# Patient Record
Sex: Female | Born: 1975 | Race: Black or African American | Hispanic: No | Marital: Single | Smoking: Former smoker
Health system: Southern US, Community
[De-identification: ages and names within clinical notes are randomized; demographics above are authoritative.]

## PROBLEM LIST (undated history)

## (undated) DIAGNOSIS — D649 Anemia, unspecified: Secondary | ICD-10-CM

## (undated) DIAGNOSIS — I10 Essential (primary) hypertension: Secondary | ICD-10-CM

## (undated) HISTORY — PX: SHOULDER ARTHROSCOPY: SHX128

## (undated) HISTORY — PX: WISDOM TOOTH EXTRACTION: SHX21

## (undated) HISTORY — PX: GASTRIC BYPASS: SHX52

## (undated) HISTORY — PX: KNEE ARTHROSCOPY: SUR90

## (undated) HISTORY — PX: TUBAL LIGATION: SHX77

---

## 1997-12-10 ENCOUNTER — Ambulatory Visit (HOSPITAL_COMMUNITY): Admission: RE | Admit: 1997-12-10 | Discharge: 1997-12-10 | Payer: Self-pay | Admitting: *Deleted

## 1998-01-23 ENCOUNTER — Ambulatory Visit (HOSPITAL_COMMUNITY): Admission: RE | Admit: 1998-01-23 | Discharge: 1998-01-23 | Payer: Self-pay | Admitting: Obstetrics

## 1998-04-03 ENCOUNTER — Ambulatory Visit (HOSPITAL_COMMUNITY): Admission: RE | Admit: 1998-04-03 | Discharge: 1998-04-03 | Payer: Self-pay

## 1998-06-17 ENCOUNTER — Inpatient Hospital Stay (HOSPITAL_COMMUNITY): Admission: AD | Admit: 1998-06-17 | Discharge: 1998-06-19 | Payer: Self-pay | Admitting: *Deleted

## 2000-03-03 ENCOUNTER — Other Ambulatory Visit: Admission: RE | Admit: 2000-03-03 | Discharge: 2000-03-03 | Payer: Self-pay | Admitting: Obstetrics and Gynecology

## 2000-04-22 ENCOUNTER — Ambulatory Visit (HOSPITAL_COMMUNITY): Admission: RE | Admit: 2000-04-22 | Discharge: 2000-04-22 | Payer: Self-pay | Admitting: Obstetrics & Gynecology

## 2000-04-22 ENCOUNTER — Encounter: Payer: Self-pay | Admitting: Obstetrics and Gynecology

## 2000-08-26 ENCOUNTER — Encounter: Payer: Self-pay | Admitting: Obstetrics and Gynecology

## 2000-08-26 ENCOUNTER — Ambulatory Visit (HOSPITAL_COMMUNITY): Admission: RE | Admit: 2000-08-26 | Discharge: 2000-08-26 | Payer: Self-pay | Admitting: Obstetrics and Gynecology

## 2000-09-10 ENCOUNTER — Encounter (INDEPENDENT_AMBULATORY_CARE_PROVIDER_SITE_OTHER): Payer: Self-pay | Admitting: Specialist

## 2000-09-10 ENCOUNTER — Inpatient Hospital Stay (HOSPITAL_COMMUNITY): Admission: AD | Admit: 2000-09-10 | Discharge: 2000-09-13 | Payer: Self-pay | Admitting: Obstetrics and Gynecology

## 2002-06-20 ENCOUNTER — Emergency Department (HOSPITAL_COMMUNITY): Admission: EM | Admit: 2002-06-20 | Discharge: 2002-06-20 | Payer: Self-pay | Admitting: Emergency Medicine

## 2002-06-22 ENCOUNTER — Emergency Department (HOSPITAL_COMMUNITY): Admission: EM | Admit: 2002-06-22 | Discharge: 2002-06-22 | Payer: Self-pay | Admitting: Emergency Medicine

## 2004-08-24 ENCOUNTER — Emergency Department (HOSPITAL_COMMUNITY): Admission: EM | Admit: 2004-08-24 | Discharge: 2004-08-24 | Payer: Self-pay | Admitting: Emergency Medicine

## 2004-12-02 ENCOUNTER — Emergency Department (HOSPITAL_COMMUNITY): Admission: EM | Admit: 2004-12-02 | Discharge: 2004-12-02 | Payer: Self-pay | Admitting: Emergency Medicine

## 2005-08-05 ENCOUNTER — Emergency Department (HOSPITAL_COMMUNITY): Admission: EM | Admit: 2005-08-05 | Discharge: 2005-08-05 | Payer: Self-pay | Admitting: Emergency Medicine

## 2005-11-28 ENCOUNTER — Emergency Department (HOSPITAL_COMMUNITY): Admission: EM | Admit: 2005-11-28 | Discharge: 2005-11-28 | Payer: Self-pay | Admitting: Emergency Medicine

## 2007-04-04 ENCOUNTER — Emergency Department: Payer: Self-pay | Admitting: Emergency Medicine

## 2007-04-06 ENCOUNTER — Emergency Department: Payer: Self-pay | Admitting: Emergency Medicine

## 2007-04-15 ENCOUNTER — Emergency Department (HOSPITAL_COMMUNITY): Admission: EM | Admit: 2007-04-15 | Discharge: 2007-04-15 | Payer: Self-pay | Admitting: Emergency Medicine

## 2007-11-14 ENCOUNTER — Ambulatory Visit: Payer: Self-pay | Admitting: Vascular Surgery

## 2007-11-14 ENCOUNTER — Emergency Department (HOSPITAL_COMMUNITY): Admission: EM | Admit: 2007-11-14 | Discharge: 2007-11-14 | Payer: Self-pay | Admitting: Emergency Medicine

## 2007-11-14 ENCOUNTER — Encounter (INDEPENDENT_AMBULATORY_CARE_PROVIDER_SITE_OTHER): Payer: Self-pay | Admitting: Emergency Medicine

## 2009-02-04 ENCOUNTER — Ambulatory Visit: Payer: Self-pay | Admitting: Surgery

## 2009-02-12 ENCOUNTER — Encounter: Admission: RE | Admit: 2009-02-12 | Discharge: 2009-05-13 | Payer: Self-pay | Admitting: Surgery

## 2009-02-28 ENCOUNTER — Ambulatory Visit: Payer: Self-pay | Admitting: Surgery

## 2009-04-25 ENCOUNTER — Ambulatory Visit: Payer: Self-pay | Admitting: Urology

## 2009-05-19 ENCOUNTER — Encounter: Admission: RE | Admit: 2009-05-19 | Discharge: 2009-08-17 | Payer: Self-pay | Admitting: Surgery

## 2009-08-11 HISTORY — PX: GASTRIC BYPASS: SHX52

## 2011-01-08 ENCOUNTER — Ambulatory Visit: Payer: Self-pay | Admitting: Internal Medicine

## 2011-01-10 ENCOUNTER — Ambulatory Visit: Payer: Self-pay | Admitting: Internal Medicine

## 2011-02-09 ENCOUNTER — Ambulatory Visit: Payer: Self-pay | Admitting: Internal Medicine

## 2011-02-17 ENCOUNTER — Ambulatory Visit: Payer: Self-pay | Admitting: Internal Medicine

## 2011-02-26 NOTE — Discharge Summary (Signed)
[facility]  Patient:    Mary Alvarado, Mary Alvarado                     MRN: [id number] Adm. Date:  [id number] Disc. Date: 09/12/00 Attending:  Michaele Offer                           Discharge Summary  DISCHARGE DIAGNOSES:          1. Intrauterine pregnancy at 38 weeks,                                  delivered.                               2. Group B strep carrier.                               3. Obesity.                               4. Desires surgical sterility.                               5. Status post precipitous vaginal delivery.                               6. Status post bilateral partial                                  salpingectomy, postpartum.  DISCHARGE MEDICATIONS:        1. Percocet one to two tablets p.o. every four                                  hours p.r.n. pain.                               2. Motrin 600 mg p.o. every six hours p.r.n.  HOSPITAL FOLLOW-UP:           The patient is to follow up in six weeks for her routine postpartum examination.  PRENATAL LABS:                A positive, antibody negative, sickle cell negative, RPR nonreactive, rubella immune, hepatitis B surface antigen negative, HIV negative,  GC negative, chlamydia negative, GBS positive.  OB HISTORY:                   In 1999 she had a spontaneous vaginal delivery at 40 weeks of a 7 pound 6 ounce infant. She had group B strep then.  PAST MEDICAL HISTORY:         None.  PAST SURGICAL HISTORY:        None.  MEDICATIONS:                  None.  ALLERGIES:  No known drug allergies.  PHYSICAL EXAMINATION:         On admission, the patients weight was approximately 295 pounds, temperature 100.3, fetal heart rate was reactive with regular contractions.  Abdomen was gravid and nontender, EFW was approximately 8 pounds.  HOSPITAL COURSE:              The patient is a 35 year old G2, P1-0-0-1 who is admitted at 3 weeks with  complaint of regular contractions.  She had no rupture of membranes or vaginal bleeding on admission. Vaginal examination on admission at 10:20 p.m. was 3 cm, 90% effaced and a -2 station.  Prenatal care was complicated by obesity making fundal heights  difficult, therefore, she had an ultrasound on August 26, 2000, which revealed a normal fetus about the 50th percentile.  The patient was admitted and begun on her penicillin prophylaxis and received an epidural approximately one hour after admission at 4 to 5 cm dilated. Though the physician was paged at 3 a.m., the patient had gone from 6 cm to 10 cm and +3 station in approximately 30 minutes.  The physician immediately reported to the hospital, however, on arrival, the infant was already delivered.  Apgars were 8 and 9.  Weight was 7 pounds 7 ounces and perineum was intact.  Placenta was spontaneously delivered and intact.  After delivery, the patient elected to proceed with a tubal ligation for permanent sterility and underwent that procedure on September 11, 2000, without complications.  She had bilateral partial salpingectomy under epidural anesthesia and then was admitted for routine postpartum and postoperative care.  On postpartum day #1, the patient was afebrile with stable vital signs. She was doing very well. Her pain was well-controlled and given the option, she elected to be discharged home with follow-up at the office as previously stated. DD:  09/12/00 TD:  09/12/00 Job: [id number]

## 2011-02-26 NOTE — Op Note (Signed)
[facility]   Patient:    Mary Alvarado, Mary Alvarado                     MRN: [id number] Proc. Date: 09/11/00 Adm. Date:  [id number] Attending:  Michaele Offer                           Operative Report  PREOPERATIVE DIAGNOSIS:       Desired surgical sterility.  POSTOPERATIVE DIAGNOSIS:      Desired surgical sterility.  OPERATION:                    Bilateral partial salpingectomy.  SURGEON:                      Zenaida Niece, M.D.  ANESTHESIA:                   Epidural.  ESTIMATED BLOOD LOSS:         Less than 50 cc.  FINDINGS:                     Normal anatomy.  COUNTS:                       Correct.  CONDITION:                    Stable.  DESCRIPTION OF PROCEDURE:     After appropriate informed consent was obtained, the patient was taken to the operating room and placed in the dorsal supine position.  Her previously placed epidural was dosed up appropriately, and her abdomen was prepped and draped in the usual sterile fashion.  The level of her anesthesia was found to be adequate, and her infraumbilical skin was infiltrated with Marcaine 0.25%.  A 3 cm horizontal incision was made, and the fascia was identified and entered sharply with scissors which also entered the peritoneal cavity.  Both fallopian tubes were identified and traced to their fimbriated ends.  A knuckle of tube on each side was ligated with 0 plain gut suture.  The knuckle of tube was removed sharply.  Both ostia were identified, and the stumps were hemostatic.  The fascia was reapproximated in a running fashion with 0 Vicryl and the skin closed with running subcuticular 3-0 Vicryl.  The patient tolerated the procedure well and was taken to the recovery room in stable condition. DD:  09/11/00 TD:  09/11/00 Job: [id number]

## 2011-03-12 ENCOUNTER — Ambulatory Visit: Payer: Self-pay

## 2011-03-12 ENCOUNTER — Ambulatory Visit: Payer: Self-pay | Admitting: Internal Medicine

## 2011-11-19 ENCOUNTER — Encounter (HOSPITAL_COMMUNITY): Payer: Self-pay | Admitting: Pharmacist

## 2011-11-19 ENCOUNTER — Other Ambulatory Visit: Payer: Self-pay | Admitting: Obstetrics and Gynecology

## 2011-11-24 ENCOUNTER — Encounter (HOSPITAL_COMMUNITY)
Admission: RE | Admit: 2011-11-24 | Discharge: 2011-11-24 | Disposition: A | Discharge status: Home / Self Care | Payer: Managed Care, Other (non HMO) | Source: Ambulatory Visit | Attending: Obstetrics and Gynecology | Admitting: Obstetrics and Gynecology

## 2011-11-24 ENCOUNTER — Encounter (HOSPITAL_COMMUNITY): Payer: Self-pay

## 2011-11-24 HISTORY — DX: Anemia, unspecified: D64.9

## 2011-11-24 LAB — CBC
HCT: 35.6 % — ABNORMAL LOW (ref 36.0–46.0)
Hemoglobin: 10.3 g/dL — ABNORMAL LOW (ref 12.0–15.0)
RDW: 18.1 % — ABNORMAL HIGH (ref 11.5–15.5)
WBC: 3.5 10*3/uL — ABNORMAL LOW (ref 4.0–10.5)

## 2011-11-24 NOTE — Progress Notes (Signed)
Patient denies having any elevated b/p in the past.  Dr. Malen Gauze was made aware of patients b/p today at pre=op appointment.  No new orders received from MD at this time.  MD states that he will evaluate the patient on the DOS. Pt was encouraged to make an appointment with her PCP for follow up.

## 2011-11-24 NOTE — Patient Instructions (Signed)
   Your procedure is scheduled on: Friday February 15  Enter through the [employer] [facility] at: 2:00pm Pick up the phone at the desk and dial [phone] and inform us of your arrival.  Please call this number if you have any problems the morning of surgery: [phone]  Remember: Do not eat food after midnight: Thursday Do not drink clear liquids after: 11:30  Do not wear jewelry, make-up, or FINGER nail polish Do not wear lotions, powders, perfumes or deodorant. Do not shave 48 hours prior to surgery. Do not bring valuables to the hospital.  Patients discharged on the day of surgery will not be allowed to drive home.    Remember to use your hibiclens as instructed.Please shower with 1/2 bottle the evening before your surgery and the other 1/2 bottle the morning of surgery.

## 2011-11-25 NOTE — H&P (Signed)
NAME:  Mary Alvarado, Mary Alvarado NO.:  [id number]  MEDICAL RECORD NO.:  [id number]  LOCATION:  [facility]                           FACILITY:  [facility]  PHYSICIAN:  Lenoard Aden, M.D.DATE OF BIRTH:  07-Dec-1975  DATE OF ADMISSION:  11/24/2011 DATE OF DISCHARGE:  11/24/2011                             HISTORY & PHYSICAL   CHIEF COMPLAINT:  Menorrhagia with secondary anemia.  She is a 36 year old African American female with known uterine fibroids, who has currently been on OCPs, iron and IUD without correction of her bleeding and secondary anemia.  ALLERGIES:  She has no known drug allergies.  MEDICATIONS:  Iron therapy, birth control pills, Mirena and multivitamin.  She is a nonsmoker, nondrinker.  She denies domestic or physical violence.  She has a pregnancy history of vaginal delivery x2, shoulder surgery, tubal ligation, gastric bypass and knee surgery.  FAMILY HISTORY:  Diabetes, hypertension.  PHYSICAL EXAM:  Well-developed, well-nourished African American female, height of 64 inches, weight of 215 pounds.  HEENT normal.  Neck supple. Full range of motion.  Lungs are clear.  Heart, regular rate and rhythm. Abdomen soft, nontender.  Pelvic exam reveals the uterus to be bulky and anteflexed.  No adnexal masses appreciated.  Two small subserosal fibroids were noted.  IMPRESSION:  Refractory menorrhagia for definitive therapy.  PLAN:  Diagnostic hysteroscopy, D and C, NovaSure endometrial ablation. IUD removal.  Risks of anesthesia, infection, bleeding, injury to abdominal organs, need for repair was discussed, delayed versus immediate complications to include bowel and bladder injury noted.  The patient acknowledges and wishes to proceed.     Lenoard Aden, M.D.     RJT/MEDQ  D:  11/25/2011  T:  11/25/2011  Job:  [id number]

## 2011-11-26 ENCOUNTER — Other Ambulatory Visit: Payer: Self-pay | Admitting: Obstetrics and Gynecology

## 2011-11-26 ENCOUNTER — Ambulatory Visit (HOSPITAL_COMMUNITY): Payer: Managed Care, Other (non HMO) | Admitting: Anesthesiology

## 2011-11-26 ENCOUNTER — Encounter (HOSPITAL_COMMUNITY): Payer: Self-pay | Admitting: Anesthesiology

## 2011-11-26 ENCOUNTER — Ambulatory Visit (HOSPITAL_COMMUNITY)
Admission: RE | Admit: 2011-11-26 | Discharge: 2011-11-26 | Disposition: A | Discharge status: Home / Self Care | Payer: Managed Care, Other (non HMO) | Source: Ambulatory Visit | Attending: Obstetrics and Gynecology | Admitting: Obstetrics and Gynecology

## 2011-11-26 ENCOUNTER — Encounter (HOSPITAL_COMMUNITY)
Admission: RE | Disposition: A | Discharge status: Home / Self Care | Payer: Self-pay | Source: Ambulatory Visit | Attending: Obstetrics and Gynecology

## 2011-11-26 ENCOUNTER — Encounter (HOSPITAL_COMMUNITY): Payer: Self-pay | Admitting: *Deleted

## 2011-11-26 DIAGNOSIS — N920 Excessive and frequent menstruation with regular cycle: Secondary | ICD-10-CM | POA: Insufficient documentation

## 2011-11-26 DIAGNOSIS — Z01818 Encounter for other preprocedural examination: Secondary | ICD-10-CM | POA: Insufficient documentation

## 2011-11-26 DIAGNOSIS — Z01812 Encounter for preprocedural laboratory examination: Secondary | ICD-10-CM | POA: Insufficient documentation

## 2011-11-26 LAB — SURGICAL PCR SCREEN: MRSA, PCR: NEGATIVE

## 2011-11-26 SURGERY — DILATATION & CURETTAGE/HYSTEROSCOPY WITH NOVASURE ABLATION
Anesthesia: General | Site: Vagina | Wound class: Clean Contaminated

## 2011-11-26 MED ORDER — MEPERIDINE HCL 25 MG/ML IJ SOLN: 6.2500 mg | INTRAMUSCULAR | Status: DC | PRN

## 2011-11-26 MED ORDER — KETOROLAC TROMETHAMINE 30 MG/ML IJ SOLN
INTRAMUSCULAR | Status: AC
  Filled 2011-11-26: qty 1

## 2011-11-26 MED ORDER — PROPOFOL 10 MG/ML IV EMUL
INTRAVENOUS | Status: AC
  Filled 2011-11-26: qty 20

## 2011-11-26 MED ORDER — FENTANYL CITRATE 0.05 MG/ML IJ SOLN
INTRAMUSCULAR | Status: DC | PRN
  Administered 2011-11-26 (×2): 50 ug via INTRAVENOUS

## 2011-11-26 MED ORDER — KETOROLAC TROMETHAMINE 30 MG/ML IJ SOLN
INTRAMUSCULAR | Status: DC | PRN
  Administered 2011-11-26 (×2): 30 mg via INTRAVENOUS

## 2011-11-26 MED ORDER — MIDAZOLAM HCL 5 MG/5ML IJ SOLN
INTRAMUSCULAR | Status: DC | PRN
  Administered 2011-11-26: 2 mg via INTRAVENOUS

## 2011-11-26 MED ORDER — MUPIROCIN 2 % EX OINT
TOPICAL_OINTMENT | CUTANEOUS | Status: AC
  Filled 2011-11-26: qty 22

## 2011-11-26 MED ORDER — CEFAZOLIN SODIUM 1-5 GM-% IV SOLN
1.0000 g | INTRAVENOUS | Status: AC
  Administered 2011-11-26: 1 g via INTRAVENOUS

## 2011-11-26 MED ORDER — CEFAZOLIN SODIUM 1-5 GM-% IV SOLN
INTRAVENOUS | Status: AC
  Filled 2011-11-26: qty 50

## 2011-11-26 MED ORDER — LACTATED RINGERS IV SOLN
INTRAVENOUS | Status: DC
  Administered 2011-11-26: 50 mL/h via INTRAVENOUS
  Administered 2011-11-26: 14:00:00 via INTRAVENOUS

## 2011-11-26 MED ORDER — BUPIVACAINE HCL (PF) 0.25 % IJ SOLN
INTRAMUSCULAR | Status: AC
  Filled 2011-11-26: qty 30

## 2011-11-26 MED ORDER — MUPIROCIN 2 % EX OINT
TOPICAL_OINTMENT | Freq: Two times a day (BID) | CUTANEOUS | Status: DC
  Administered 2011-11-26: 14:00:00 via NASAL

## 2011-11-26 MED ORDER — OXYCODONE-ACETAMINOPHEN 5-325 MG PO TABS: 1.0000 | ORAL_TABLET | ORAL | Status: AC | PRN

## 2011-11-26 MED ORDER — LIDOCAINE HCL (CARDIAC) 20 MG/ML IV SOLN
INTRAVENOUS | Status: AC
  Filled 2011-11-26: qty 5

## 2011-11-26 MED ORDER — BUPIVACAINE HCL (PF) 0.25 % IJ SOLN
INTRAMUSCULAR | Status: DC | PRN
  Administered 2011-11-26: 30 mL

## 2011-11-26 MED ORDER — LIDOCAINE HCL (CARDIAC) 20 MG/ML IV SOLN
INTRAVENOUS | Status: DC | PRN
  Administered 2011-11-26: 20 mg via INTRAVENOUS

## 2011-11-26 MED ORDER — FENTANYL CITRATE 0.05 MG/ML IJ SOLN
INTRAMUSCULAR | Status: AC
  Filled 2011-11-26: qty 2

## 2011-11-26 MED ORDER — MIDAZOLAM HCL 2 MG/2ML IJ SOLN
INTRAMUSCULAR | Status: AC
  Filled 2011-11-26: qty 2

## 2011-11-26 MED ORDER — PROPOFOL 10 MG/ML IV EMUL
INTRAVENOUS | Status: DC | PRN
  Administered 2011-11-26: 160 mg via INTRAVENOUS

## 2011-11-26 MED ORDER — ONDANSETRON HCL 4 MG/2ML IJ SOLN
INTRAMUSCULAR | Status: DC | PRN
  Administered 2011-11-26: 4 mg via INTRAVENOUS

## 2011-11-26 MED ORDER — DROPERIDOL 2.5 MG/ML IJ SOLN
INTRAMUSCULAR | Status: AC
  Filled 2011-11-26: qty 2

## 2011-11-26 MED ORDER — METOCLOPRAMIDE HCL 5 MG/ML IJ SOLN: 10.0000 mg | Freq: Once | INTRAMUSCULAR | Status: DC | PRN

## 2011-11-26 SURGICAL SUPPLY — 12 items
ABLATOR ENDOMETRIAL BIPOLAR (ABLATOR) ×2 IMPLANT
CATH ROBINSON RED A/P 16FR (CATHETERS) ×2 IMPLANT
CLOTH BEACON ORANGE TIMEOUT ST (SAFETY) ×2 IMPLANT
CONTAINER PREFILL 10% NBF 60ML (FORM) ×4 IMPLANT
GLOVE BIO SURGEON STRL SZ7.5 (GLOVE) ×4 IMPLANT
GOWN PREVENTION PLUS LG XLONG (DISPOSABLE) ×2 IMPLANT
GOWN STRL REIN XL XLG (GOWN DISPOSABLE) ×2 IMPLANT
PACK HYSTEROSCOPY LF (CUSTOM PROCEDURE TRAY) ×2 IMPLANT
PAD PREP 24X48 CUFFED NSTRL (MISCELLANEOUS) ×2 IMPLANT
SYR TB 1ML 25GX5/8 (SYRINGE) ×2 IMPLANT
TOWEL OR 17X24 6PK STRL BLUE (TOWEL DISPOSABLE) ×4 IMPLANT
WATER STERILE IRR 1000ML POUR (IV SOLUTION) ×2 IMPLANT

## 2011-11-26 NOTE — Anesthesia Postprocedure Evaluation (Signed)
Anesthesia Post Note  Patient: Mary Alvarado  Procedure(s) Performed: Procedure(s) (LRB): DILATATION & CURETTAGE/HYSTEROSCOPY WITH NOVASURE ABLATION (N/A)  Anesthesia type: General  Patient location: PACU  Post pain: Pain level controlled  Post assessment: Post-op Vital signs reviewed  Last Vitals:  Filed Vitals:   11/26/11 1420  BP: 167/95  Pulse: 73  Temp: 37 C  Resp: 18    Post vital signs: Reviewed  Level of consciousness: sedated  Complications: No apparent anesthesia complicationsfj

## 2011-11-26 NOTE — Anesthesia Preprocedure Evaluation (Signed)
Anesthesia Evaluation  Patient identified by MRN, date of birth, ID band Patient awake    Reviewed: Allergy & Precautions, H&P , NPO status , Patient's Chart, lab work & pertinent test results  Airway Mallampati: II TM Distance: >3 FB Neck ROM: Full    Dental No notable dental hx. (+) Teeth Intact   Pulmonary neg pulmonary ROS,  clear to auscultation  Pulmonary exam normal       Cardiovascular neg cardio ROS Regular Normal    Neuro/Psych Negative Neurological ROS  Negative Psych ROS   GI/Hepatic Neg liver ROS, S/P Gastric Bypass Surgery   Endo/Other  Negative Endocrine ROS  Renal/GU negative Renal ROS  Genitourinary negative   Musculoskeletal negative musculoskeletal ROS (+)   Abdominal (+) obese,   Peds  Hematology negative hematology ROS (+)   Anesthesia Other Findings   Reproductive/Obstetrics negative OB ROS                           Anesthesia Physical Anesthesia Plan  ASA: II  Anesthesia Plan: General   Post-op Pain Management:    Induction: Intravenous  Airway Management Planned: LMA  Additional Equipment:   Intra-op Plan:   Post-operative Plan: Extubation in OR  Informed Consent: I have reviewed the patients History and Physical, chart, labs and discussed the procedure including the risks, benefits and alternatives for the proposed anesthesia with the patient or authorized representative who has indicated his/her understanding and acceptance.   Dental advisory given  Plan Discussed with: CRNA, Anesthesiologist and Surgeon  Anesthesia Plan Comments:         Anesthesia Quick Evaluation

## 2011-11-26 NOTE — Progress Notes (Signed)
Pt's blood pressure 165/93 today.. Dr Malen Gauze aware and no new orders given.

## 2011-11-26 NOTE — Discharge Instructions (Signed)
DISCHARGE INSTRUCTIONS: HYSTEROSCOPY / ENDOMETRIAL ABLATION The following instructions have been prepared to help you care for yourself upon your return home.  May take Ibuprofen/Motrin products after 9:35 PM as needed for cramps  Personal hygiene: Marland Kitchen Use sanitary pads for vaginal drainage, not tampons. . Shower the day after your procedure. . NO tub baths, pools or Jacuzzis for 2-3 weeks. . Wipe front to back after using the bathroom.  Activity and limitations: . Do NOT drive or operate any equipment for 24 hours. The effects of anesthesia are still present and drowsiness may result. . Do NOT rest in bed all day. . Walking is encouraged. . Walk up and down stairs slowly. . You may resume your normal activity in one to two days or as indicated by your physician.  Sexual activity: NO intercourse for at least 2 weeks after the procedure, or as indicated by your Doctor.  Diet: Eat a light meal as desired this evening. You may resume your usual diet tomorrow.  Return to Work: You may resume your work activities in one to two days or as indicated by Therapist, sports.  What to expect after your surgery: Expect to have vaginal bleeding/discharge for 2-3 days and spotting for up to 10 days. It is not unusual to have soreness for up to 1-2 weeks. You may have a slight burning sensation when you urinate for the first day. Mild cramps may continue for a couple of days. You may have a regular period in 2-6 weeks.  Call your doctor for any of the following: . Excessive vaginal bleeding or clotting, saturating and changing one pad every hour. . Inability to urinate 6 hours after discharge from hospital. . Pain not relieved by pain medication. . Fever of 100.4 F or greater. . Unusual vaginal discharge or odor.  Post Anesthesia Care Unit [phone]

## 2011-11-26 NOTE — Progress Notes (Signed)
Patient ID: Mary Alvarado, female   DOB: Mar 13, 1976, 36 y.o.   MRN: [id number] Patient seen and examined. Consent witnessed and signed. No changes noted. Update completed.

## 2011-11-26 NOTE — Transfer of Care (Signed)
Immediate Anesthesia Transfer of Care Note  Patient: Mary Alvarado  Procedure(s) Performed: Procedure(s) (LRB): DILATATION & CURETTAGE/HYSTEROSCOPY WITH NOVASURE ABLATION (N/A)  Patient Location: PACU  Anesthesia Type: General  Level of Consciousness: awake  Airway & Oxygen Therapy: Patient Spontanous Breathing and Patient connected to nasal cannula oxygen  Post-op Assessment: Report given to PACU RN and Post -op Vital signs reviewed and stable  Post vital signs: Reviewed and stable  Complications: No apparent anesthesia complications

## 2011-11-26 NOTE — Op Note (Signed)
11/26/2011  3:39 PM  PATIENT:  Rella Larve  36 y.o. female  PRE-OPERATIVE DIAGNOSIS:  Menorrhagia  POST-OPERATIVE DIAGNOSIS:  Menorrhagia  PROCEDURE:  Procedure(s): DILATATION & CURETTAGE/HYSTEROSCOPY WITH NOVASURE ABLATION  SURGEON:  Surgeon(s): Lenoard Aden, MD  ASSISTANTS: none   ANESTHESIA:   local and general  ESTIMATED BLOOD LOSS: * No blood loss amount entered *   DRAINS: none   LOCAL MEDICATIONS USED:  MARCAINE     SPECIMEN:  Source of Specimen:  EMC and polyp  DISPOSITION OF SPECIMEN:  PATHOLOGY  COUNTS:  YES  DICTATION #: noted  PLAN OF CARE: DC home  PATIENT DISPOSITION:  PACU - hemodynamically stable.

## 2011-11-27 NOTE — Op Note (Signed)
NAME:  Mary Alvarado, Mary Alvarado NO.:  [id number]  MEDICAL RECORD NO.:  [id number]  LOCATION:  [facility]                          FACILITY:  [facility]  PHYSICIAN:  Lenoard Aden, M.D.DATE OF BIRTH:  1976/03/25  DATE OF PROCEDURE: DATE OF DISCHARGE:  11/26/2011                              OPERATIVE REPORT   DESCRIPTION OF PROCEDURE:  After being apprised of risks of anesthesia, infection, bleeding, injury to abdominal organs, need for repair, delayed versus immediate complications to include bowel and bladder injury, possible need for repair, the patient brought to the operating room, where she was administered general anesthetic, prepped and draped in usual sterile fashion.  Feet were placed in Yellofin stirrups.  After achieving adequate anesthesia, dilute Marcaine solution was placed and a paracervical block 20 mL total. Mirena IUD was removed intact without difficulty. Cervix was easily dilated up to a 25 Pratt dilator.  Hysteroscope placed.  Visualization reveals capacious endometrial cavity with small endometrial polyp, which was removed with polyp forceps.  The D and C was performed using sharp curettage in a four-quadrant method without difficulty.  The NovaSure device was placed, seated in the appropriate fashion to a length of 6.5, a width of 4.7 for 122 seconds after negative CO2 test and the device was removed, inspected, and found to be intact.  Revisualization reveals a well- ablated endometrial cavity without evidence of uterine perforation. Fluid deficit of 5 mL noted.  All instruments removed.  The patient tolerated procedure well and is transferred to the recovery room in good condition.     Lenoard Aden, M.D.     RJT/MEDQ  D:  11/26/2011  T:  11/27/2011  Job:  [id number]

## 2014-06-23 ENCOUNTER — Emergency Department (HOSPITAL_COMMUNITY): Payer: Managed Care, Other (non HMO)

## 2014-06-23 ENCOUNTER — Encounter (HOSPITAL_COMMUNITY): Payer: Self-pay | Admitting: Emergency Medicine

## 2014-06-23 ENCOUNTER — Emergency Department (HOSPITAL_COMMUNITY)
Admission: EM | Admit: 2014-06-23 | Discharge: 2014-06-23 | Disposition: A | Discharge status: Home / Self Care | Payer: Managed Care, Other (non HMO) | Attending: Emergency Medicine | Admitting: Emergency Medicine

## 2014-06-23 DIAGNOSIS — Z862 Personal history of diseases of the blood and blood-forming organs and certain disorders involving the immune mechanism: Secondary | ICD-10-CM | POA: Insufficient documentation

## 2014-06-23 DIAGNOSIS — S46909A Unspecified injury of unspecified muscle, fascia and tendon at shoulder and upper arm level, unspecified arm, initial encounter: Secondary | ICD-10-CM | POA: Diagnosis not present

## 2014-06-23 DIAGNOSIS — Y9389 Activity, other specified: Secondary | ICD-10-CM | POA: Insufficient documentation

## 2014-06-23 DIAGNOSIS — S4980XA Other specified injuries of shoulder and upper arm, unspecified arm, initial encounter: Secondary | ICD-10-CM | POA: Diagnosis not present

## 2014-06-23 DIAGNOSIS — S6990XA Unspecified injury of unspecified wrist, hand and finger(s), initial encounter: Secondary | ICD-10-CM | POA: Diagnosis not present

## 2014-06-23 DIAGNOSIS — S8990XA Unspecified injury of unspecified lower leg, initial encounter: Secondary | ICD-10-CM | POA: Insufficient documentation

## 2014-06-23 DIAGNOSIS — Z3202 Encounter for pregnancy test, result negative: Secondary | ICD-10-CM | POA: Insufficient documentation

## 2014-06-23 DIAGNOSIS — IMO0002 Reserved for concepts with insufficient information to code with codable children: Secondary | ICD-10-CM | POA: Diagnosis not present

## 2014-06-23 DIAGNOSIS — S99919A Unspecified injury of unspecified ankle, initial encounter: Principal | ICD-10-CM

## 2014-06-23 DIAGNOSIS — F411 Generalized anxiety disorder: Secondary | ICD-10-CM | POA: Insufficient documentation

## 2014-06-23 DIAGNOSIS — Y92410 Unspecified street and highway as the place of occurrence of the external cause: Secondary | ICD-10-CM | POA: Diagnosis not present

## 2014-06-23 DIAGNOSIS — S99929A Unspecified injury of unspecified foot, initial encounter: Principal | ICD-10-CM

## 2014-06-23 DIAGNOSIS — S59919A Unspecified injury of unspecified forearm, initial encounter: Secondary | ICD-10-CM

## 2014-06-23 DIAGNOSIS — F17200 Nicotine dependence, unspecified, uncomplicated: Secondary | ICD-10-CM | POA: Diagnosis not present

## 2014-06-23 DIAGNOSIS — S0990XA Unspecified injury of head, initial encounter: Secondary | ICD-10-CM | POA: Insufficient documentation

## 2014-06-23 DIAGNOSIS — S59909A Unspecified injury of unspecified elbow, initial encounter: Secondary | ICD-10-CM | POA: Insufficient documentation

## 2014-06-23 DIAGNOSIS — Z79899 Other long term (current) drug therapy: Secondary | ICD-10-CM | POA: Diagnosis not present

## 2014-06-23 LAB — LACTIC ACID, PLASMA: LACTIC ACID, VENOUS: 1.2 mmol/L (ref 0.5–2.2)

## 2014-06-23 LAB — CBC WITH DIFFERENTIAL/PLATELET
Basophils Absolute: 0 10*3/uL (ref 0.0–0.1)
Basophils Relative: 0 % (ref 0–1)
Eosinophils Absolute: 0.3 10*3/uL (ref 0.0–0.7)
Eosinophils Relative: 4 % (ref 0–5)
HCT: 37.5 % (ref 36.0–46.0)
HEMOGLOBIN: 12 g/dL (ref 12.0–15.0)
LYMPHS ABS: 1.3 10*3/uL (ref 0.7–4.0)
LYMPHS PCT: 19 % (ref 12–46)
MCH: 29.5 pg (ref 26.0–34.0)
MCHC: 32 g/dL (ref 30.0–36.0)
MCV: 92.1 fL (ref 78.0–100.0)
MONOS PCT: 11 % (ref 3–12)
Monocytes Absolute: 0.7 10*3/uL (ref 0.1–1.0)
NEUTROS PCT: 66 % (ref 43–77)
Neutro Abs: 4.5 10*3/uL (ref 1.7–7.7)
PLATELETS: 297 10*3/uL (ref 150–400)
RBC: 4.07 MIL/uL (ref 3.87–5.11)
RDW: 18.7 % — ABNORMAL HIGH (ref 11.5–15.5)
WBC: 6.8 10*3/uL (ref 4.0–10.5)

## 2014-06-23 LAB — BASIC METABOLIC PANEL
Anion gap: 12 (ref 5–15)
BUN: 13 mg/dL (ref 6–23)
CHLORIDE: 106 meq/L (ref 96–112)
CO2: 22 meq/L (ref 19–32)
Calcium: 8.4 mg/dL (ref 8.4–10.5)
Creatinine, Ser: 0.8 mg/dL (ref 0.50–1.10)
GFR calc Af Amer: >90 mL/min (ref >90)
GLUCOSE: 94 mg/dL (ref 70–99)
POTASSIUM: 4 meq/L (ref 3.7–5.3)
SODIUM: 140 meq/L (ref 137–147)

## 2014-06-23 LAB — URINALYSIS, ROUTINE W REFLEX MICROSCOPIC
Bilirubin Urine: NEGATIVE
Glucose, UA: NEGATIVE mg/dL
Hgb urine dipstick: NEGATIVE
Ketones, ur: 15 mg/dL — AB
LEUKOCYTES UA: NEGATIVE
NITRITE: NEGATIVE
PROTEIN: NEGATIVE mg/dL
Specific Gravity, Urine: 1.011 (ref 1.005–1.030)
UROBILINOGEN UA: 1 mg/dL (ref 0.0–1.0)
pH: 7 (ref 5.0–8.0)

## 2014-06-23 LAB — PREGNANCY, URINE: Preg Test, Ur: NEGATIVE

## 2014-06-23 MED ORDER — DIAZEPAM 5 MG PO TABS: 5.0000 mg | ORAL_TABLET | Freq: Two times a day (BID) | ORAL | Status: DC | PRN

## 2014-06-23 MED ORDER — SODIUM CHLORIDE 0.9 % IV BOLUS (SEPSIS)
1000.0000 mL | Freq: Once | INTRAVENOUS | Status: AC
  Administered 2014-06-23: 1000 mL via INTRAVENOUS

## 2014-06-23 MED ORDER — HYDROCODONE-ACETAMINOPHEN 5-325 MG PO TABS
2.0000 | ORAL_TABLET | Freq: Once | ORAL | Status: AC
  Administered 2014-06-23: 2 via ORAL
  Filled 2014-06-23: qty 2

## 2014-06-23 MED ORDER — IBUPROFEN 800 MG PO TABS: 800.0000 mg | ORAL_TABLET | Freq: Three times a day (TID) | ORAL | Status: AC | PRN

## 2014-06-23 MED ORDER — FENTANYL CITRATE 0.05 MG/ML IJ SOLN
100.0000 ug | Freq: Once | INTRAMUSCULAR | Status: AC
  Administered 2014-06-23: 100 ug via INTRAVENOUS
  Filled 2014-06-23: qty 2

## 2014-06-23 MED ORDER — HYDROCODONE-ACETAMINOPHEN 5-325 MG PO TABS: 2.0000 | ORAL_TABLET | Freq: Two times a day (BID) | ORAL | Status: DC | PRN
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
## 2014-06-23 NOTE — ED Notes (Signed)
Patient transported to CT 

## 2014-06-23 NOTE — ED Notes (Addendum)
Pt in radiology for an hour.  Pain reassessed completed upon pt's return.
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
## 2014-12-27 ENCOUNTER — Ambulatory Visit: Payer: Managed Care, Other (non HMO) | Admitting: Gastroenterology

## 2015-11-26 ENCOUNTER — Other Ambulatory Visit: Payer: Self-pay | Admitting: Cardiology

## 2015-11-26 DIAGNOSIS — Z1231 Encounter for screening mammogram for malignant neoplasm of breast: Secondary | ICD-10-CM

## 2015-12-18 ENCOUNTER — Ambulatory Visit
Admission: RE | Admit: 2015-12-18 | Discharge: 2015-12-18 | Disposition: A | Discharge status: Home / Self Care | Payer: Managed Care, Other (non HMO) | Source: Ambulatory Visit | Attending: Cardiology | Admitting: Cardiology

## 2015-12-18 DIAGNOSIS — Z1231 Encounter for screening mammogram for malignant neoplasm of breast: Secondary | ICD-10-CM

## 2015-12-25 ENCOUNTER — Other Ambulatory Visit: Payer: Self-pay | Admitting: Cardiology

## 2015-12-25 DIAGNOSIS — R928 Other abnormal and inconclusive findings on diagnostic imaging of breast: Secondary | ICD-10-CM

## 2015-12-30 ENCOUNTER — Ambulatory Visit
Admission: RE | Admit: 2015-12-30 | Discharge: 2015-12-30 | Disposition: A | Discharge status: Home / Self Care | Payer: Managed Care, Other (non HMO) | Source: Ambulatory Visit | Attending: Cardiology | Admitting: Cardiology

## 2015-12-30 ENCOUNTER — Other Ambulatory Visit: Payer: Self-pay | Admitting: Cardiology

## 2015-12-30 DIAGNOSIS — R928 Other abnormal and inconclusive findings on diagnostic imaging of breast: Secondary | ICD-10-CM

## 2015-12-31 ENCOUNTER — Other Ambulatory Visit: Payer: Managed Care, Other (non HMO)

## 2016-03-10 IMAGING — MG MM DIGITAL SCREENING BILAT W/ CAD
6 series · 6 of 6 positions shown · non-contrast
Comparison: None.

CLINICAL DATA: Screening.

EXAM:
DIGITAL SCREENING BILATERAL MAMMOGRAM WITH CAD

[L MLO (1 of 2)]
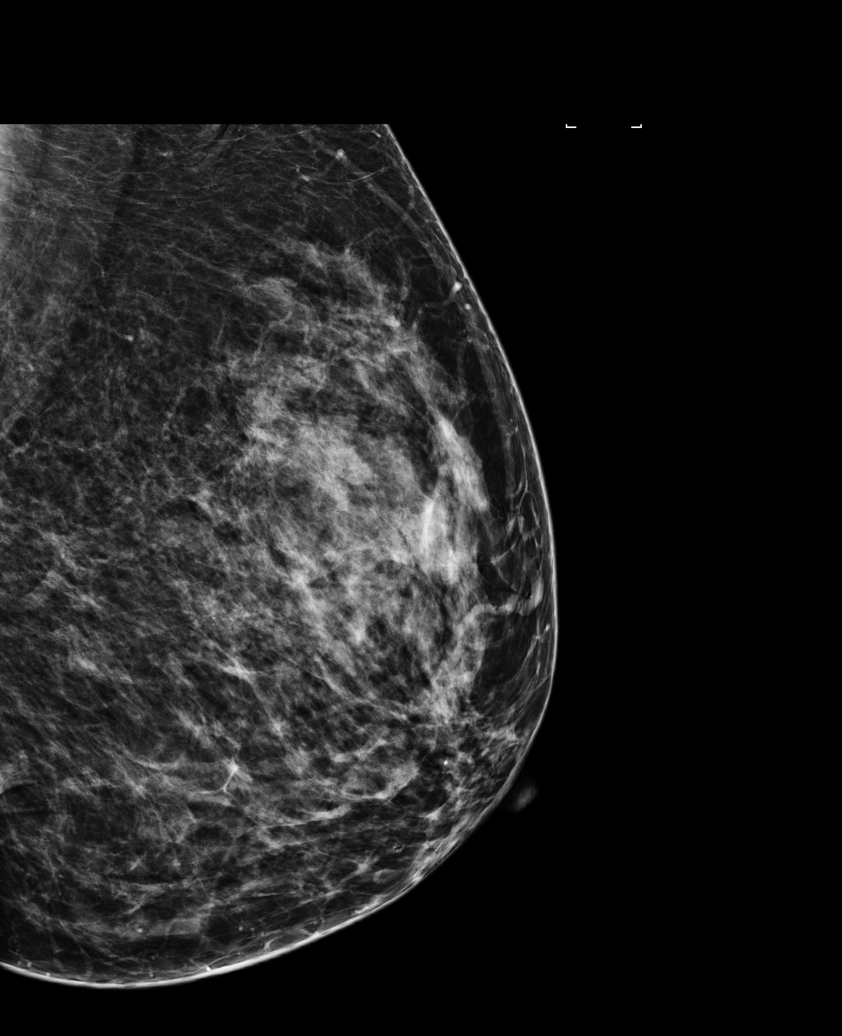

[R MLO (1 of 2)]
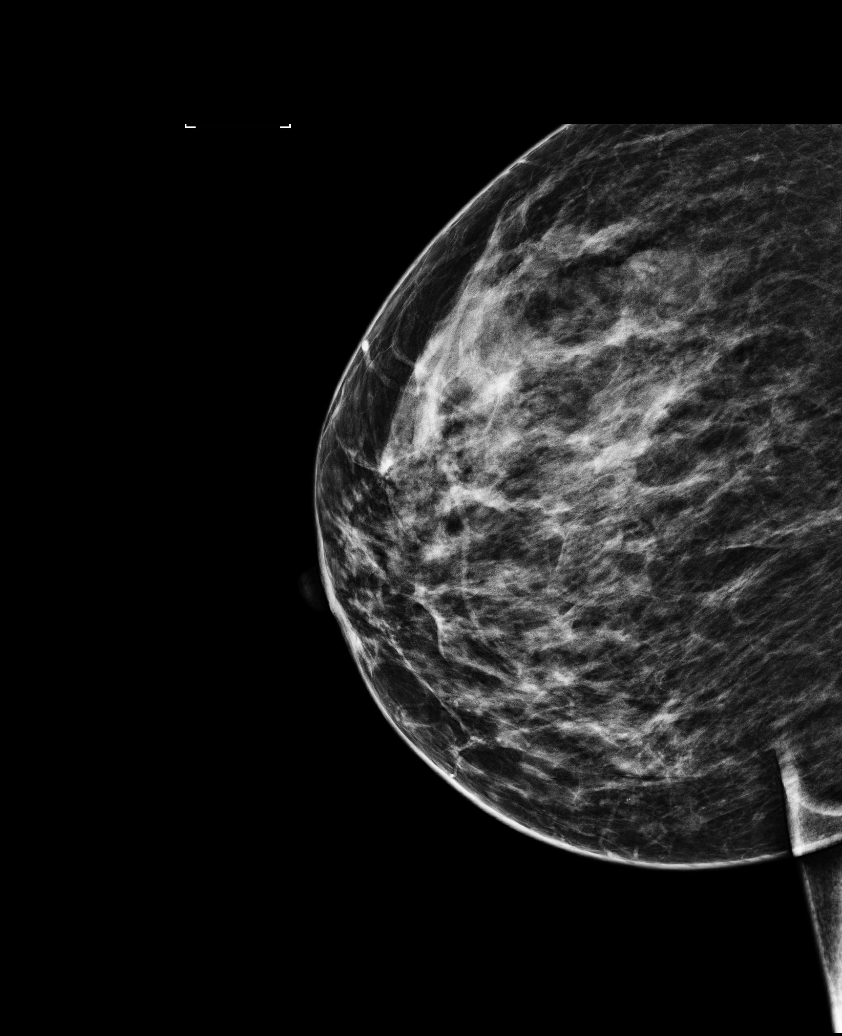

[L CC]
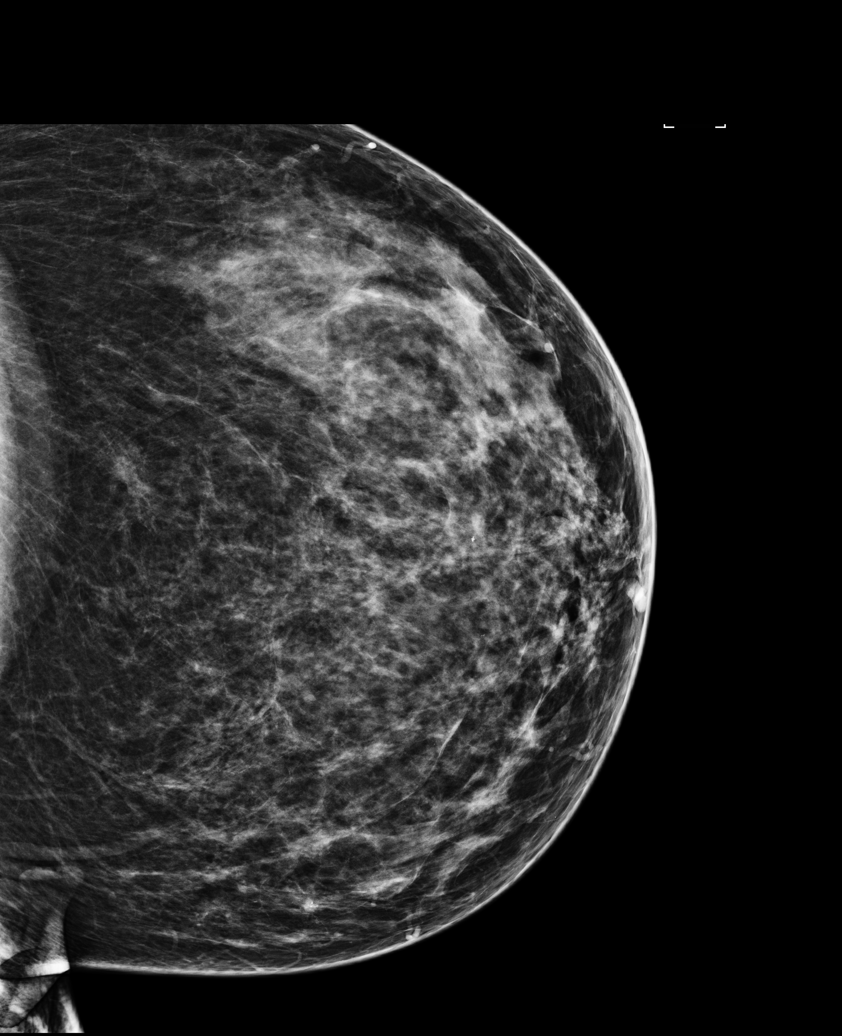

[L MLO (2 of 2)]
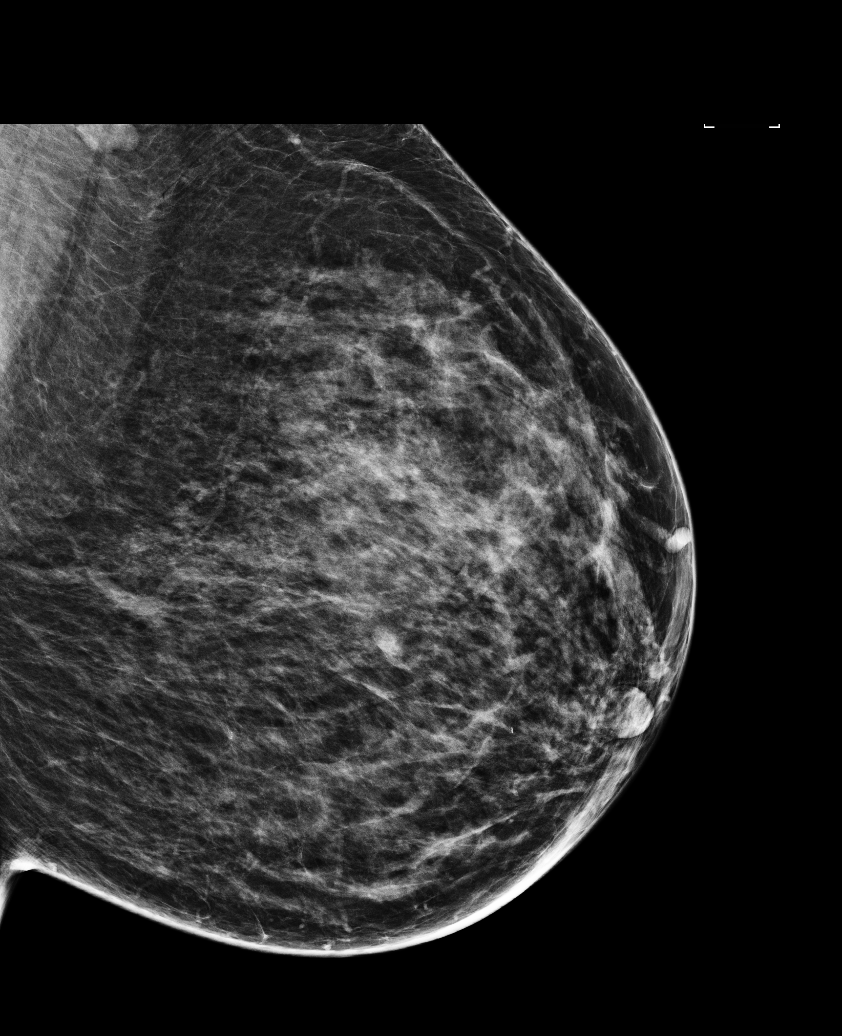

[R CC]
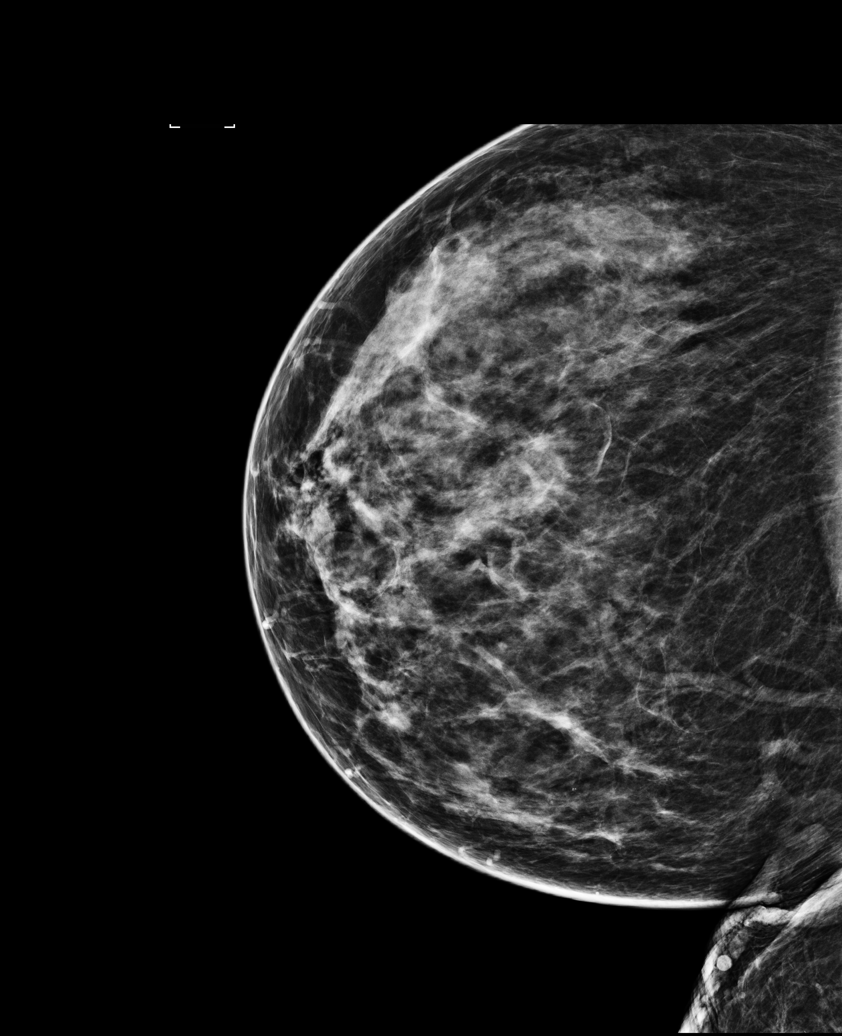

[R MLO (2 of 2)]
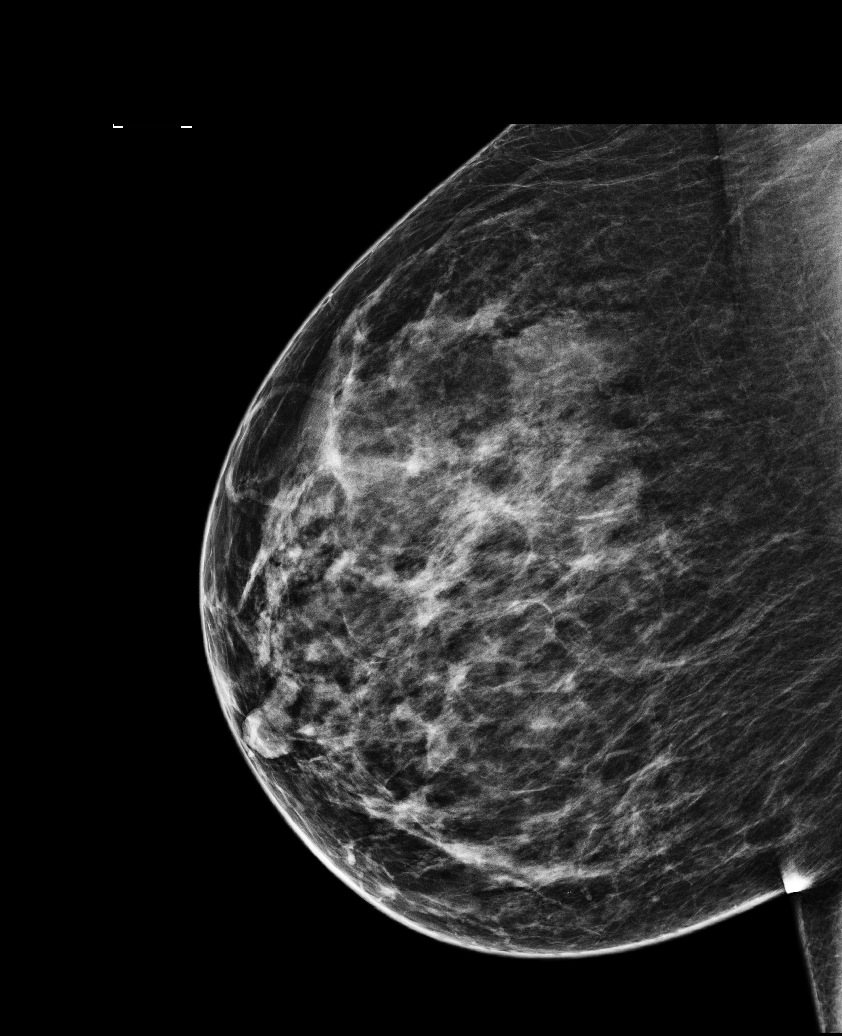

[6 of 6 positions shown; findings below may reference images not displayed]

ACR Breast Density Category c: The breast tissue is heterogeneously
dense, which may obscure small masses.
FINDINGS: In the left breast, 2 asymmetries warrant further evaluation. In the
right breast, no findings suspicious for malignancy. Images were
processed with CAD.
IMPRESSION: Further evaluation is suggested for 2 asymmetries in the left
breast.

RECOMMENDATION:
Diagnostic mammogram and possibly ultrasound of the left breast.
(Code:[id number])

The patient will be contacted regarding the findings, and additional
imaging will be scheduled.

BI-RADS CATEGORY  0: Incomplete. Need additional imaging evaluation
and/or prior mammograms for comparison.

## 2018-02-21 ENCOUNTER — Encounter (HOSPITAL_COMMUNITY): Payer: Self-pay | Admitting: Emergency Medicine

## 2018-02-21 ENCOUNTER — Other Ambulatory Visit: Payer: Self-pay

## 2018-02-21 ENCOUNTER — Emergency Department (HOSPITAL_BASED_OUTPATIENT_CLINIC_OR_DEPARTMENT_OTHER)
Admit: 2018-02-21 | Discharge: 2018-02-21 | Disposition: A | Discharge status: Home / Self Care | Payer: Self-pay | Attending: Emergency Medicine | Admitting: Emergency Medicine

## 2018-02-21 ENCOUNTER — Emergency Department (HOSPITAL_COMMUNITY)
Admission: EM | Admit: 2018-02-21 | Discharge: 2018-02-21 | Disposition: A | Discharge status: Home / Self Care | Payer: Self-pay | Attending: Emergency Medicine | Admitting: Emergency Medicine

## 2018-02-21 DIAGNOSIS — M79609 Pain in unspecified limb: Secondary | ICD-10-CM

## 2018-02-21 DIAGNOSIS — Z87891 Personal history of nicotine dependence: Secondary | ICD-10-CM | POA: Insufficient documentation

## 2018-02-21 DIAGNOSIS — M79605 Pain in left leg: Secondary | ICD-10-CM | POA: Insufficient documentation

## 2018-02-21 DIAGNOSIS — M7989 Other specified soft tissue disorders: Secondary | ICD-10-CM

## 2018-02-21 DIAGNOSIS — L819 Disorder of pigmentation, unspecified: Secondary | ICD-10-CM | POA: Insufficient documentation

## 2018-02-21 DIAGNOSIS — D539 Nutritional anemia, unspecified: Secondary | ICD-10-CM | POA: Insufficient documentation

## 2018-02-21 DIAGNOSIS — R2242 Localized swelling, mass and lump, left lower limb: Secondary | ICD-10-CM | POA: Insufficient documentation

## 2018-02-21 DIAGNOSIS — R609 Edema, unspecified: Secondary | ICD-10-CM

## 2018-02-21 LAB — CBC WITH DIFFERENTIAL/PLATELET
Basophils Absolute: 0 10*3/uL (ref 0.0–0.1)
Basophils Relative: 0 %
EOS ABS: 0.3 10*3/uL (ref 0.0–0.7)
Eosinophils Relative: 10 %
HCT: 28.4 % — ABNORMAL LOW (ref 36.0–46.0)
Hemoglobin: 9.7 g/dL — ABNORMAL LOW (ref 12.0–15.0)
LYMPHS PCT: 49 %
Lymphs Abs: 1.5 10*3/uL (ref 0.7–4.0)
MCH: 42.4 pg — AB (ref 26.0–34.0)
MCHC: 34.2 g/dL (ref 30.0–36.0)
MCV: 124 fL — AB (ref 78.0–100.0)
MONOS PCT: 4 %
Monocytes Absolute: 0.1 10*3/uL (ref 0.1–1.0)
NEUTROS ABS: 1.1 10*3/uL — AB (ref 1.7–7.7)
Neutrophils Relative %: 37 %
Platelets: 180 10*3/uL (ref 150–400)
RBC: 2.29 MIL/uL — AB (ref 3.87–5.11)
RDW: 16 % — ABNORMAL HIGH (ref 11.5–15.5)
WBC: 3.1 10*3/uL — AB (ref 4.0–10.5)

## 2018-02-21 LAB — IRON AND TIBC
Iron: 81 ug/dL (ref 28–170)
Saturation Ratios: 33 % — ABNORMAL HIGH (ref 10.4–31.8)
TIBC: 246 ug/dL — ABNORMAL LOW (ref 250–450)
UIBC: 165 ug/dL

## 2018-02-21 LAB — I-STAT CHEM 8, ED
BUN: 13 mg/dL (ref 6–20)
CALCIUM ION: 1.17 mmol/L (ref 1.15–1.40)
CHLORIDE: 104 mmol/L (ref 101–111)
Creatinine, Ser: 0.8 mg/dL (ref 0.44–1.00)
Glucose, Bld: 87 mg/dL (ref 65–99)
HCT: 30 % — ABNORMAL LOW (ref 36.0–46.0)
Hemoglobin: 10.2 g/dL — ABNORMAL LOW (ref 12.0–15.0)
Potassium: 4 mmol/L (ref 3.5–5.1)
SODIUM: 141 mmol/L (ref 135–145)
TCO2: 27 mmol/L (ref 22–32)

## 2018-02-21 LAB — RETICULOCYTES
RBC.: 2.21 MIL/uL — AB (ref 3.87–5.11)
RETIC COUNT ABSOLUTE: 46.4 10*3/uL (ref 19.0–186.0)
RETIC CT PCT: 2.1 % (ref 0.4–3.1)

## 2018-02-21 LAB — FOLATE: Folate: 11.1 ng/mL (ref >5.9)

## 2018-02-21 LAB — FERRITIN: FERRITIN: 64 ng/mL (ref 11–307)

## 2018-02-21 LAB — VITAMIN B12: Vitamin B-12: 27 pg/mL — ABNORMAL LOW (ref 180–914)

## 2018-02-21 MED ORDER — IBUPROFEN 600 MG PO TABS: 600.0000 mg | ORAL_TABLET | Freq: Three times a day (TID) | ORAL | 0 refills | Status: DC | PRN

## 2018-02-21 MED ORDER — TRAMADOL HCL 50 MG PO TABS: 50.0000 mg | ORAL_TABLET | Freq: Four times a day (QID) | ORAL | 0 refills | Status: DC | PRN

## 2018-02-21 MED ORDER — IBUPROFEN 200 MG PO TABS
400.0000 mg | ORAL_TABLET | Freq: Once | ORAL | Status: AC
  Administered 2018-02-21: 400 mg via ORAL
  Filled 2018-02-21: qty 2

## 2018-02-21 NOTE — Progress Notes (Signed)
Came to triage,was told patient does not have a stretcher or bed, asked RN to put patient in a room where has a stretcher so that the exam can be done. Will come back later.  Hongying Doralene Glanz (RDMS RVT) 02/21/18 8:32 AM

## 2018-02-21 NOTE — Discharge Instructions (Addendum)
It was our pleasure to provide your ER care today - we hope that you feel better.  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 Follow up with primary care doctor and/or hematologist in the next 1-2 weeks - call to arrange appointment.   The ultrasound shows no dvt, but does show a fluid collection behind the knee - this could be a bakers cyst or ruptured bakers cyst. Follow up with orthopedic doctor in the next 1-2 weeks.   Return to ER if worse, new symptoms, fevers, increased swelling, redness, new or severe pain, other concern.   Take motrin or aleve as need for pain. You may also take ultram as need for pain - no driving when taking.

## 2018-02-21 NOTE — ED Triage Notes (Signed)
Pt is c/o left leg pain  Pt states the pain started a couple of weeks ago in her ankle area and now the pain is in her calf area and behind her left knee  Pt states calf feels swollen  Pt also adds that her hands and feet keep turning dark in color

## 2018-02-21 NOTE — Progress Notes (Signed)
Preliminary notes--Left lower extremity venous duplex exam completed. Negative for DVT.  There is a complex fluid collection at popliteal fossa, measuring 4.02x1.31x3.01cm.      Hongying Richardson Dopp (RDMS RVT) 02/21/18 12:13 PM

## 2018-02-21 NOTE — ED Notes (Signed)
Report gven to RN

## 2018-02-21 NOTE — ED Provider Notes (Signed)
[facility]-EMERGENCY DEPT Provider Note   CSN: [id number] Arrival date & time: 02/21/18  0029     History   Chief Complaint Chief Complaint  Patient presents with  . Leg Pain    HPI Mary Alvarado is a 42 y.o. female.  Patient c/o left lower leg pain/swelling in the past 1-2 weeks. Pain constant, dull, moderate. No consistent or specific exacerbating or alleviating factors. Denies specific injury or strain to area. Denies leg numbness/weakness. No rash or skin lesions. Denies hx dvt or pe. No cp or sob. Pt also notes that bil hands and feet seem darker in color for past several months. No recent change. No numbness. Symptoms constant. No change w cold exposure.   The history is provided by the patient.  Leg Pain      Past Medical History:  Diagnosis Date  . Anemia     There are no active problems to display for this patient.   Past Surgical History:  Procedure Laterality Date  . GASTRIC BYPASS    . KNEE ARTHROSCOPY    . SHOULDER ARTHROSCOPY    . TUBAL LIGATION       OB History   None      Home Medications    Prior to Admission medications   Medication Sig Start Date End Date Taking? Authorizing Provider  diazepam (VALIUM) 5 MG tablet Take 1 tablet (5 mg total) by mouth every 12 (twelve) hours as needed for muscle spasms. 06/23/14   Tomasita Crumble, MD  HYDROcodone-acetaminophen (NORCO/VICODIN) 5-325 MG per tablet Take 2 tablets by mouth 2 (two) times daily as needed for severe pain. 06/23/14   Tomasita Crumble, MD  ibuprofen (ADVIL,MOTRIN) 800 MG tablet Take 1 tablet (800 mg total) by mouth every 8 (eight) hours as needed for mild pain or moderate pain. 06/23/14   Tomasita Crumble, MD  Prenatal Vit-Fe Fumarate-FA (PRENATAL MULTIVITAMIN) TABS tablet Take 1 tablet by mouth daily at 12 noon.    [provider]    Family History Family History  Problem Relation Age of Onset  . Diabetes Mother   . Hypertension Mother     Social  History Social History   Tobacco Use  . Smoking status: Former Games developer  . Smokeless tobacco: Never Used  Substance Use Topics  . Alcohol use: No  . Drug use: No     Allergies   Patient has no known allergies.   Review of Systems Review of Systems  Constitutional: Negative for fever.  HENT: Negative for sore throat.   Eyes: Negative for redness.  Respiratory: Negative for shortness of breath.   Cardiovascular: Negative for chest pain.  Gastrointestinal: Negative for abdominal pain.  Genitourinary: Negative for flank pain.  Musculoskeletal: Negative for back pain.  Skin: Negative for rash.  Neurological: Negative for headaches.  Hematological: Does not bruise/bleed easily.  Psychiatric/Behavioral: Negative for confusion.     Physical Exam Updated Vital Signs BP (!) 155/84 (BP Location: Right Arm)   Pulse 73   Temp 98.7 F (37.1 C) (Oral)   Resp 18   Ht 1.626 m ( )   Wt 108.4 kg (239 lb)   LMP 02/10/2018 (Exact Date)   SpO2 100%   BMI 41.02 kg/m   Physical Exam  Constitutional: She appears well-developed and well-nourished. No distress.  HENT:  Mouth/Throat: Oropharynx is clear and moist.  Eyes: Conjunctivae are normal. No scleral icterus.  Neck: Neck supple. No tracheal deviation present.  Cardiovascular: Normal rate and intact distal  pulses.  Pulmonary/Chest: Effort normal. No respiratory distress.  Abdominal: Normal appearance. She exhibits no distension.  Musculoskeletal: She exhibits no edema.  Tenderness left calf. Dp/pt palp bil. Good rom at left knee and ankle without pain. No gross leg edema noted. No mass.   Neurological: She is alert.  Skin: Skin is warm and dry. No rash noted. She is not diaphoretic.  Hands appear grossly normal to me (pt states color of skin of her hands/feet is darker as compared to her normal).  Psychiatric: She has a normal mood and affect.  Nursing note and vitals reviewed.    ED Treatments / Results  Labs (all labs  ordered are listed, but only abnormal results are displayed) Labs Reviewed  CBC WITH DIFFERENTIAL/PLATELET - Abnormal; Notable for the following components:      Result Value   WBC 3.1 (*)    RBC 2.29 (*)    Hemoglobin 9.7 (*)    HCT 28.4 (*)    MCV 124.0 (*)    MCH 42.4 (*)    RDW 16.0 (*)    Neutro Abs 1.1 (*)    All other components within normal limits  I-STAT CHEM 8, ED - Abnormal; Notable for the following components:   Hemoglobin 10.2 (*)    HCT 30.0 (*)    All other components within normal limits  VITAMIN B12  FOLATE  IRON AND TIBC  FERRITIN  RETICULOCYTES    EKG None  Radiology No results found.  Procedures Procedures (including critical care time)  Medications Ordered in ED Medications  ibuprofen (ADVIL,MOTRIN) tablet 400 mg (has no administration in time range)     Initial Impression / Assessment and Plan / ED Course  I have reviewed the triage vital signs and the nursing notes.  Pertinent labs & imaging results that were available during my care of the patient were reviewed by me and considered in my medical decision making (see chart for details).  Labs sent from triage - pt noted to be anemic, macrocytic. Pt notes hx anemia, but denies knowledge of specific type of anemia or cause. Will add anemia panel, and discussed diff, and need for outpt f/u. Also discussed outpt f/u re chronic 'darker' appearance of hands/feet.   Motrin po. Will get vascular u/s r/o dvt for c/o of leg lower leg pain/swelling.   No dvt on vascular u/s. ?possible bakers cyst/ruptured bakers cyst. There is no skin warmth or erythema. No abscess to area. No pain w passive rom knee. No fever or chills.   Discussed w pt, given macrocytic anemia, other symptoms, possible causes, vit b12 def, etc - pt will f/u pcp. Anemia panel still pending - discussed w pt, results available by tomorrow.   Recheck pt comfortable, no distress. Appears stable for d/c.     Final Clinical  Impressions(s) / ED Diagnoses   Final diagnoses:  None    ED Discharge Orders    None       Cathren Laine, MD 02/21/18 1326

## 2018-02-22 ENCOUNTER — Other Ambulatory Visit: Payer: Self-pay | Admitting: Oncology

## 2018-02-22 NOTE — ED Provider Notes (Signed)
Followed up on labs from yesterday.   Patients vit b12 level is very low, likely responsible for her anemia/other symptoms.   I discussed result with pt, and need for b12 injection. I also told her recent note from Dr Darnelle Catalan indicates they will be arranging f/u in their clinic for b12 therapy and f/u. Pt voices her understanding.   If she has not heard from clinic in 24 hrs, she will contact them, pcp, and/or return to ER for initiation of therapy.      Cathren Laine, MD 02/22/18 1210

## 2018-02-23 ENCOUNTER — Telehealth: Payer: Self-pay | Admitting: Oncology

## 2018-02-23 ENCOUNTER — Encounter: Payer: Self-pay | Admitting: Oncology

## 2018-02-23 NOTE — Telephone Encounter (Signed)
Lft the pt a vm to cb and schedule an appt with Dr. Darnelle Catalan

## 2018-02-23 NOTE — Telephone Encounter (Signed)
Appt has been scheduled for the pt to see Dr. Darnelle Catalan on 5/31 at 1230pm. Pt aware to arrive 30 minutes early. Letter mailed.

## 2018-02-24 DIAGNOSIS — D510 Vitamin B12 deficiency anemia due to intrinsic factor deficiency: Secondary | ICD-10-CM | POA: Insufficient documentation

## 2018-02-24 DIAGNOSIS — D519 Vitamin B12 deficiency anemia, unspecified: Secondary | ICD-10-CM | POA: Insufficient documentation

## 2018-02-24 NOTE — Telephone Encounter (Signed)
Added b12 injection after 5/31 new patient visit with GM per GM - see also 5/17 sch msg.

## 2018-02-28 ENCOUNTER — Other Ambulatory Visit: Payer: Self-pay | Admitting: Oncology

## 2018-02-28 NOTE — Progress Notes (Signed)
Patient called and rescheduled for insurance reasons

## 2018-03-10 ENCOUNTER — Inpatient Hospital Stay: Payer: Managed Care, Other (non HMO) | Admitting: Oncology

## 2018-03-10 ENCOUNTER — Inpatient Hospital Stay: Payer: Managed Care, Other (non HMO)

## 2018-03-10 ENCOUNTER — Telehealth: Payer: Self-pay | Admitting: *Deleted

## 2018-03-10 NOTE — Telephone Encounter (Signed)
This RN spoke with pt per her call stating her insurance is not valid until 03/11/2018. Pt would like to reschedule appointments to after 03/11/2018 for better coverage ( she would only have a $25 co pay vs incurring total cost ).  This RN rescheduled appointments per MD review. Called pt and obtained VM - message left regarding new appointment dates.

## 2018-03-16 NOTE — Progress Notes (Signed)
[facility]  Telephone:([phone] [phone]   NOTE: PATIENT DID NOT SHOW FOR 03/17/2018 VISIT  ID: Rella Larve DOB: 07-22-1976  MR#: [id number]  MWU#:[id number]  Patient Care Team: Patient, No Pcp Per as PCP - General (General Practice) Jaxn Chiquito, Valentino Hue, MD as Consulting Physician (Oncology) OTHER MD:  CHIEF COMPLAINT:   CURRENT TREATMENT:    HISTORY OF CURRENT ILLNESS: The patient presented to the emergency room 02/21/2018 complaining of discomfort and swelling in her left lower leg.  Doppler ultrasonography was obtained and showed no evidence of deep vein thrombosis on either leg.  However there was a cystic structure in the popliteal fossa, likely consistent with a Baker's cyst.  In the course of admission the patient was found to be anemic, with a hemoglobin of 9.7, MCV 124, white cell count 3.1, and platelets are 90,000.  We have lab work from June 23, 2014, when the patient had a white cell count of 6.8, hemoglobin of 12, MCV 92.1 and platelets 297,000.  Additional work-up for anemia on 02/21/2018 showed a B12 level of 27, folate 11.1, ferritin 64, TIBC 246, saturation 33%, and reticulocyte count 46.4.  The patient was referred for further evaluation and treatment.   The patient's subsequent history is as detailed below.  INTERVAL HISTORY: The patient was seen in the hematology clinic 03/17/2018 accompanied by    REVIEW OF SYSTEMS: Jacqulyne reports . Marland Kitchen   PAST MEDICAL HISTORY: Past Medical History:  Diagnosis Date  . Anemia     PAST SURGICAL HISTORY: Past Surgical History:  Procedure Laterality Date  . GASTRIC BYPASS    . KNEE ARTHROSCOPY    . SHOULDER ARTHROSCOPY    . TUBAL LIGATION    Endometrial polyp removal 11/26/2011  FAMILY HISTORY Family History  Problem Relation Age of Onset  . Diabetes Mother   . Hypertension Mother     GYNECOLOGIC HISTORY:  No LMP recorded. Menarche:  years old Age at first live birth:   years old GX P  LMP  Contraceptive HRT   Hysterectomy?  SO?    SOCIAL HISTORY:      ADVANCED DIRECTIVES:    HEALTH MAINTENANCE: Social History   Tobacco Use  . Smoking status: Former Games developer  . Smokeless tobacco: Never Used  Substance Use Topics  . Alcohol use: No  . Drug use: No     Colonoscopy:  PAP:  Bone density:   No Known Allergies  Current Outpatient Medications  Medication Sig Dispense Refill  . ibuprofen (ADVIL,MOTRIN) 600 MG tablet Take 1 tablet (600 mg total) by mouth every 8 (eight) hours as needed. Take with food. 20 tablet 0  . ibuprofen (ADVIL,MOTRIN) 800 MG tablet Take 1 tablet (800 mg total) by mouth every 8 (eight) hours as needed for mild pain or moderate pain. 21 tablet 0  . IRON PO Take 1 tablet by mouth daily.    . Prenatal Vit-Fe Fumarate-FA (PRENATAL MULTIVITAMIN) TABS tablet Take 1 tablet by mouth daily at 12 noon.    . traMADol (ULTRAM) 50 MG tablet Take 1 tablet (50 mg total) by mouth every 6 (six) hours as needed. 15 tablet 0   No current facility-administered medications for this visit.     OBJECTIVE:  There were no vitals filed for this visit.   There is no height or weight on file to calculate BMI.   Wt Readings from Last 3 Encounters:  02/21/18 239 lb (108.4 kg)  06/23/14 214 lb (97.1 kg)  11/24/11  214 lb (97.1 kg)      ECOG FS:    LAB RESULTS:  CMP     Component Value Date/Time   NA 141 02/21/2018 0137   K 4.0 02/21/2018 0137   CL 104 02/21/2018 0137   CO2 22 06/23/2014 0431   GLUCOSE 87 02/21/2018 0137   BUN 13 02/21/2018 0137   CREATININE 0.80 02/21/2018 0137   CALCIUM 8.4 06/23/2014 0431   GFRNONAA >90 06/23/2014 0431   GFRAA >90 06/23/2014 0431    No results found for: TOTALPROTELP, ALBUMINELP, A1GS, A2GS, BETS, BETA2SER, GAMS, MSPIKE, SPEI  No results found for: KPAFRELGTCHN, LAMBDASER, KAPLAMBRATIO  Lab Results  Component Value Date   WBC 3.1 (L) 02/21/2018   NEUTROABS 1.1 (L) 02/21/2018   HGB 10.2  (L) 02/21/2018   HCT 30.0 (L) 02/21/2018   MCV 124.0 (H) 02/21/2018   PLT 180 02/21/2018    @LASTCHEMISTRY @  No results found for: LABCA2  No components found for: [id number]  No results for input(s): INR in the last 168 hours.  No results found for: LABCA2  No results found for: [id number]  No results found for: BJY782CAN125  No results found for: NFA213CAN153  No results found for: CA2729  No components found for: HGQUANT  No results found for: CEA1 / No results found for: CEA1   No results found for: AFPTUMOR  No results found for: CHROMOGRNA  No results found for: PSA1  No visits with results within 3 Day(s) from this visit.  Latest known visit with results is:  Admission on 02/21/2018, Discharged on 02/21/2018  Component Date Value Ref Range Status  . WBC 02/21/2018 3.1* 4.0 - 10.5 K/uL Final  . RBC 02/21/2018 2.29* 3.87 - 5.11 MIL/uL Final  . Hemoglobin 02/21/2018 9.7* 12.0 - 15.0 g/dL Final  . HCT 08/65/784605/14/2019 28.4* 36.0 - 46.0 % Final  . MCV 02/21/2018 124.0* 78.0 - 100.0 fL Final  . MCH 02/21/2018 42.4* 26.0 - 34.0 pg Final  . MCHC 02/21/2018 34.2  30.0 - 36.0 g/dL Final  . RDW 96/29/528405/14/2019 16.0* 11.5 - 15.5 % Final  . Platelets 02/21/2018 180  150 - 400 K/uL Final  . Neutrophils Relative % 02/21/2018 37  % Final  . Neutro Abs 02/21/2018 1.1* 1.7 - 7.7 K/uL Final  . Lymphocytes Relative 02/21/2018 49  % Final  . Lymphs Abs 02/21/2018 1.5  0.7 - 4.0 K/uL Final  . Monocytes Relative 02/21/2018 4  % Final  . Monocytes Absolute 02/21/2018 0.1  0.1 - 1.0 K/uL Final  . Eosinophils Relative 02/21/2018 10  % Final  . Eosinophils Absolute 02/21/2018 0.3  0.0 - 0.7 K/uL Final  . Basophils Relative 02/21/2018 0  % Final  . Basophils Absolute 02/21/2018 0.0  0.0 - 0.1 K/uL Final   Performed at [facility], [address] [place], [place] [address]  . Sodium 02/21/2018 141  135 - 145 mmol/L Final  . Potassium 02/21/2018 4.0  3.5 - 5.1 mmol/L Final  . Chloride  02/21/2018 104  101 - 111 mmol/L Final  . BUN 02/21/2018 13  6 - 20 mg/dL Final  . Creatinine, Ser 02/21/2018 0.80  0.44 - 1.00 mg/dL Final  . Glucose, Bld 01/02/725305/14/2019 87  65 - 99 mg/dL Final  . Calcium, Ion 66/44/034705/14/2019 1.17  1.15 - 1.40 mmol/L Final  . TCO2 02/21/2018 27  22 - 32 mmol/L Final  . Hemoglobin 02/21/2018 10.2* 12.0 - 15.0 g/dL Final  . HCT 42/59/563805/14/2019 30.0* 36.0 - 46.0 %  Final  . Vitamin B-12 02/21/2018 27* 180 - 914 pg/mL Final   Comment: (NOTE) This assay is not validated for testing neonatal or myeloproliferative syndrome specimens for Vitamin B12 levels. Performed at [facility], [address], [place], [place] [address]   . Folate 02/21/2018 11.1  >5.9 ng/mL Final   Performed at [facility], [address] [place], [place] [address]  . Iron 02/21/2018 81  28 - 170 ug/dL Final  . TIBC 47/42/5956 246* 250 - 450 ug/dL Final  . Saturation Ratios 02/21/2018 33* 10.4 - 31.8 % Final  . UIBC 02/21/2018 165  ug/dL Final   Performed at [facility] [place] [facility], [address], [place], [place] [address]  . Ferritin 02/21/2018 64  11 - 307 ng/mL Final   Performed at [facility], [address] [place], [place] [address]  . Retic Ct Pct 02/21/2018 2.1  0.4 - 3.1 % Final  . RBC. 02/21/2018 2.21* 3.87 - 5.11 MIL/uL Final  . Retic Count, Absolute 02/21/2018 46.4  19.0 - 186.0 K/uL Final   Performed at [facility], [address] [place], [place] [address]    (this displays the last labs from the last 3 days)  No results found for: TOTALPROTELP, ALBUMINELP, A1GS, A2GS, BETS, BETA2SER, GAMS, MSPIKE, SPEI (this displays SPEP labs)  No results found for: KPAFRELGTCHN, LAMBDASER, KAPLAMBRATIO (kappa/lambda light chains)  No results found for: HGBA, HGBA2QUANT, HGBFQUANT, HGBSQUAN (Hemoglobinopathy evaluation)   No results found for: LDH  Lab Results  Component Value Date   IRON 81 02/21/2018   TIBC 246 (L) 02/21/2018   IRONPCTSAT 33 (H)  02/21/2018   (Iron and TIBC)  Lab Results  Component Value Date   FERRITIN 64 02/21/2018    Urinalysis    Component Value Date/Time   COLORURINE YELLOW 06/23/2014 0716   APPEARANCEUR CLEAR 06/23/2014 0716   LABSPEC 1.011 06/23/2014 0716   PHURINE 7.0 06/23/2014 0716   GLUCOSEU NEGATIVE 06/23/2014 0716   HGBUR NEGATIVE 06/23/2014 0716   BILIRUBINUR NEGATIVE 06/23/2014 0716   KETONESUR 15 (A) 06/23/2014 0716   PROTEINUR NEGATIVE 06/23/2014 0716   UROBILINOGEN 1.0 06/23/2014 0716   NITRITE NEGATIVE 06/23/2014 0716   LEUKOCYTESUR NEGATIVE 06/23/2014 0716     STUDIES: No results found.  ELIGIBLE FOR AVAILABLE RESEARCH PROTOCOL:   ASSESSMENT: 42 y.o. [place] woman with a history of gastric bypasspresenting May 2019 with severe B12 deficiency  (1) intrinsic factor and antiparietal cell antibodies test pending  (2) B12 supplementation TO START  PLAN:      Deshaun Schou, Valentino Hue, MD  03/16/18 3:33 PM Medical Oncology and Hematology [facility] [address] [place], [place] [address] Tel. [phone]    Fax. [phone]  Fonnie Birkenhead, am acting as scribe for Lowella Dell MD.  I, Ruthann Cancer MD, have reviewed the above documentation for accuracy and completeness, and I agree with the above.

## 2018-03-17 ENCOUNTER — Inpatient Hospital Stay: Payer: Managed Care, Other (non HMO)

## 2018-03-17 ENCOUNTER — Encounter: Payer: Self-pay | Admitting: Oncology

## 2018-03-17 ENCOUNTER — Ambulatory Visit: Payer: Managed Care, Other (non HMO) | Admitting: Oncology

## 2018-03-17 ENCOUNTER — Inpatient Hospital Stay: Payer: Managed Care, Other (non HMO) | Attending: Oncology

## 2018-03-20 ENCOUNTER — Encounter: Payer: Managed Care, Other (non HMO) | Admitting: Oncology

## 2018-03-20 ENCOUNTER — Ambulatory Visit: Payer: Managed Care, Other (non HMO)

## 2018-03-20 ENCOUNTER — Other Ambulatory Visit: Payer: Managed Care, Other (non HMO)

## 2018-03-31 ENCOUNTER — Telehealth: Payer: Self-pay | Admitting: Oncology

## 2018-03-31 NOTE — Telephone Encounter (Signed)
Lft the pt a vm to reschedule appt °

## 2018-04-04 ENCOUNTER — Telehealth: Payer: Self-pay | Admitting: Oncology

## 2018-04-04 NOTE — Telephone Encounter (Signed)
Lft vm to reschedule appt with Dr. Darnelle CatalanMagrinat

## 2018-04-09 NOTE — Progress Notes (Signed)
[facility]  Telephone:(336) [phone] [phone]) [phone]   NOTE: PATIENT DID NOT SHOW FOR 03/17/2018 VISIT  ID: Rella Larve DOB: [birth year]  MR#: [id number]  JYN#:[id number]  Patient Care Team: Patient, No Pcp Per as PCP - General (General Practice) Magrinat, Valentino Hue, MD as Consulting Physician (Oncology) Olivia Mackie, MD as Consulting Physician (Obstetrics and Gynecology) Cathren Laine, MD (Emergency Medicine) Ronnald Collum as Physician Assistant (Cardiology) Princella Pellegrini, PA Simoncic, Gay Filler, DMD (Dentistry) OTHER MD:    CHIEF COMPLAINT: B12 deficiency  CURRENT TREATMENT: B12 supplementation   HISTORY OF CURRENT ILLNESS: From the original intake note:  The patient presented to the emergency room 02/21/2018 complaining of discomfort and swelling in her left lower leg.  Doppler ultrasonography was obtained and showed no evidence of deep vein thrombosis on either leg.  However there was a cystic structure in the popliteal fossa, likely consistent with a Baker's cyst.  In the course of admission the patient was found to be anemic, with a hemoglobin of 9.7, MCV 124, white cell count 3.1, and platelets are 90,000.  We have lab work from June 23, 2014, when the patient had a white cell count of 6.8, hemoglobin of 12, MCV 92.1 and platelets 297,000.  Additional work-up for anemia on 02/21/2018 showed a B12 level of 27, folate 11.1, ferritin 64, TIBC 246, saturation 33%, and reticulocyte count 46.4.  The patient was referred for further evaluation and treatment.  Her subsequent history is as detailed below.  INTERVAL HISTORY: The patient missed her hematology appointment 03/17/2018, but returned to the hematology clinic on 04/10/2018.   REVIEW OF SYSTEMS: Cardelia reports that she is wondering what caused her hands and feet to turn purple in the ED. She feels tired.  For exercise, she tries to walk and stay active with her children.  She denies  unusual headaches, visual changes, nausea, vomiting, or dizziness. There has been no unusual cough, phlegm production, or pleurisy. This been no change in bowel or bladder habits. She denies unexplained fatigue or unexplained weight loss, bleeding, rash, or fever. A detailed review of systems was otherwise stable.    PAST MEDICAL HISTORY: Past Medical History:  Diagnosis Date  . Anemia     PAST SURGICAL HISTORY: Past Surgical History:  Procedure Laterality Date  . GASTRIC BYPASS    . KNEE ARTHROSCOPY    . SHOULDER ARTHROSCOPY    . TUBAL LIGATION    Endometrial polyp removal 11/26/2011   FAMILY HISTORY Family History  Problem Relation Age of Onset  . Diabetes Mother   . Hypertension Mother   The patient's father is alive at age 68. The patient's mother is alive at  48. The patient has 5 brothers and 2 sisters. She denies a family with blood disorders. She reports 2 maternal uncles with pancreatic cancer. There was a paternal aunt that passed away from stomach cancer. She denies a family history of breast or ovarian cancer.   GYNECOLOGIC HISTORY:  No LMP recorded. Menarche: 42  years old Age at first live birth: 42  years old She is GXP2 LMP: regular that lasting 3 days. She has 2 heavy days.   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 She is single. The patient's oldest is son Joselyn Glassman age 96 who is looking for jobs and expecting a child in August 2019. The patient's youngest is daughter Marya Amsler age 64 in the 11th grade and has a child. The patient will have 2 grandchildren.  ADVANCED DIRECTIVES:    HEALTH MAINTENANCE: Social History   Tobacco Use  . Smoking status: Former Games developer  . Smokeless tobacco: Never Used  Substance Use Topics  . Alcohol use: No  . Drug use: No     Colonoscopy:   PAP:   Bone density:    No Known Allergies  Current Outpatient Medications  Medication Sig Dispense Refill  . ibuprofen (ADVIL,MOTRIN) 600 MG tablet Take 1  tablet (600 mg total) by mouth every 8 (eight) hours as needed. Take with food. 20 tablet 0  . ibuprofen (ADVIL,MOTRIN) 800 MG tablet Take 1 tablet (800 mg total) by mouth every 8 (eight) hours as needed for mild pain or moderate pain. 21 tablet 0  . IRON PO Take 1 tablet by mouth daily.    . Prenatal Vit-Fe Fumarate-FA (PRENATAL MULTIVITAMIN) TABS tablet Take 1 tablet by mouth daily at 12 noon.    . traMADol (ULTRAM) 50 MG tablet Take 1 tablet (50 mg total) by mouth every 6 (six) hours as needed. 15 tablet 0   No current facility-administered medications for this visit.     OBJECTIVE: Middle-aged African-American woman examined with chaperone in place  Vitals:   04/10/18 1507  BP: 128/76  Pulse: 76  Resp: 18  Temp: 98.9 F (37.2 C)  SpO2: 100%     Body mass index is 41.08 kg/m.   Wt Readings from Last 3 Encounters:  04/10/18 239 lb 4.8 oz (108.5 kg)  02/21/18 239 lb (108.4 kg)  06/23/14 214 lb (97.1 kg)      ECOG FS: 1  Sclerae unicteric, EOMs intact Oropharynx clear and moist No cervical or supraclavicular adenopathy Lungs no rales or rhonchi Heart regular rate and rhythm Abd soft, nontender, positive bowel sounds MSK no focal spinal tenderness, no upper extremity lymphedema Neuro: nonfocal, well oriented, appropriate affect Breasts: Deferred   LAB RESULTS:  CMP     Component Value Date/Time   NA 141 02/21/2018 0137   K 4.0 02/21/2018 0137   CL 104 02/21/2018 0137   CO2 22 06/23/2014 0431   GLUCOSE 87 02/21/2018 0137   BUN 13 02/21/2018 0137   CREATININE 0.80 02/21/2018 0137   CALCIUM 8.4 06/23/2014 0431   GFRNONAA >90 06/23/2014 0431   GFRAA >90 06/23/2014 0431    No results found for: TOTALPROTELP, ALBUMINELP, A1GS, A2GS, BETS, BETA2SER, GAMS, MSPIKE, SPEI  No results found for: KPAFRELGTCHN, LAMBDASER, KAPLAMBRATIO  Lab Results  Component Value Date   WBC 3.5 (L) 04/10/2018   NEUTROABS 1.4 (L) 04/10/2018   HGB 11.7 04/10/2018   HCT 36.7  04/10/2018   MCV 103.7 (H) 04/10/2018   PLT 219 04/10/2018    @LASTCHEMISTRY @  No results found for: LABCA2  No components found for: [id number]  No results for input(s): INR in the last 168 hours.  No results found for: LABCA2  No results found for: VQQ595  No results found for: GLO756  No results found for: EPP295  No results found for: CA2729  No components found for: HGQUANT  No results found for: CEA1 / No results found for: CEA1   No results found for: AFPTUMOR  No results found for: CHROMOGRNA  No results found for: PSA1  Appointment on 04/10/2018  Component Date Value Ref Range Status  . WBC 04/10/2018 3.5* 3.9 - 10.3 K/uL Final  . RBC 04/10/2018 3.54* 3.70 - 5.45 MIL/uL Final  . Hemoglobin 04/10/2018 11.7  11.6 - 15.9 g/dL Final  . HCT 18/84/1660 36.7  34.8 -  46.6 % Final  . MCV 04/10/2018 103.7* 79.5 - 101.0 fL Final  . MCH 04/10/2018 33.1  25.1 - 34.0 pg Final  . MCHC 04/10/2018 31.9  31.5 - 36.0 g/dL Final  . RDW 52/84/1324 21.7* 11.2 - 14.5 % Final  . Platelets 04/10/2018 219  145 - 400 K/uL Final  . Neutrophils Relative % 04/10/2018 40  % Final  . Neutro Abs 04/10/2018 1.4* 1.5 - 6.5 K/uL Final  . Lymphocytes Relative 04/10/2018 40  % Final  . Lymphs Abs 04/10/2018 1.4  0.9 - 3.3 K/uL Final  . Monocytes Relative 04/10/2018 12  % Final  . Monocytes Absolute 04/10/2018 0.4  0.1 - 0.9 K/uL Final  . Eosinophils Relative 04/10/2018 8  % Final  . Eosinophils Absolute 04/10/2018 0.3  0.0 - 0.5 K/uL Final  . Basophils Relative 04/10/2018 0  % Final  . Basophils Absolute 04/10/2018 0.0  0.0 - 0.1 K/uL Final   Performed at [facility], [address], [place], [place] [address]  . Retic Ct Pct 04/10/2018 0.7  0.7 - 2.1 % Final  . RBC. 04/10/2018 3.54* 3.70 - 5.45 MIL/uL Final  . Retic Count, Absolute 04/10/2018 24.8* 33.7 - 90.7 K/uL Final   Performed at [facility], [address] [place], [place]  [address]  . Smear Review 04/10/2018 SMEAR STAINED AND AVAILABLE FOR REVIEW   Final   Performed at [facility], [address] [place], [place] [address]    (this displays the last labs from the last 3 days)  No results found for: TOTALPROTELP, ALBUMINELP, A1GS, A2GS, BETS, BETA2SER, GAMS, MSPIKE, SPEI (this displays SPEP labs)  No results found for: KPAFRELGTCHN, LAMBDASER, KAPLAMBRATIO (kappa/lambda light chains)  No results found for: HGBA, HGBA2QUANT, HGBFQUANT, HGBSQUAN (Hemoglobinopathy evaluation)   No results found for: LDH  Lab Results  Component Value Date   IRON 81 02/21/2018   TIBC 246 (L) 02/21/2018   IRONPCTSAT 33 (H) 02/21/2018   (Iron and TIBC)  Lab Results  Component Value Date   FERRITIN 64 02/21/2018    Urinalysis    Component Value Date/Time   COLORURINE YELLOW 06/23/2014 0716   APPEARANCEUR CLEAR 06/23/2014 0716   LABSPEC 1.011 06/23/2014 0716   PHURINE 7.0 06/23/2014 0716   GLUCOSEU NEGATIVE 06/23/2014 0716   HGBUR NEGATIVE 06/23/2014 0716   BILIRUBINUR NEGATIVE 06/23/2014 0716   KETONESUR 15 (A) 06/23/2014 0716   PROTEINUR NEGATIVE 06/23/2014 0716   UROBILINOGEN 1.0 06/23/2014 0716   NITRITE NEGATIVE 06/23/2014 0716   LEUKOCYTESUR NEGATIVE 06/23/2014 0716     STUDIES: No results found.  ELIGIBLE FOR AVAILABLE RESEARCH PROTOCOL:   ASSESSMENT: 42 y.o. [place] woman with a history of gastric bypasspresenting May 2019 with severe B12 deficiency  (1) intrinsic factor and antiparietal cell antibodies test pending  (2) B12 supplementation starting 04/10/2018  PLAN: I spent approximately 40 minutes with the patient discussing the types of blood cells we make, their roles, and the specific role of red cells.  We discussed anemia and she understands this means not enough red cells.  While she may have some component of anemia of bleeding, since she continues to menstruate regularly (and there is evidence of iron  deficiency anemia and lab work she had performed 6 years ago, when her hemoglobin was 10.3 and MCV 97.7), I believe the main reason for her anemia at present is B12 deficiency, and this also of course explains her very large MCV with inappropriately  low reticulocyte count  She tells me she has been on oral B12 supplementation since she had her bypass surgery.  She must not be absorbing the B12 correctly if that is so.  Accordingly she will start parenteral B12 supplementations today.  I am setting her up for B12 shots every 4 weeks.  She may learn to give these to herself, or obtain them through her primary care physician, or if she prefers to have them here then we can continue to do that indefinitely.  I will see her again in January and thereafter if she wishes to continue to receive the shots here I will see her once a year indefinitely.     Magrinat, Valentino HueGustav C, MD  04/10/18 3:22 PM Medical Oncology and Hematology [facility] [address] [place], [place] [address] Tel. [phone]    Fax. [phone]  Fonnie BirkenheadI, Arielle Pollard, am acting as scribe for Lowella DellGustav C Magrinat MD.  I, Ruthann CancerGustav Magrinat MD, have reviewed the above documentation for accuracy and completeness, and I agree with the above.

## 2018-04-10 ENCOUNTER — Inpatient Hospital Stay: Payer: Managed Care, Other (non HMO)

## 2018-04-10 ENCOUNTER — Inpatient Hospital Stay: Payer: Managed Care, Other (non HMO) | Attending: Oncology | Admitting: Oncology

## 2018-04-10 VITALS — BP 128/76 | HR 76 | Temp 98.9°F | Resp 18 | Ht 64.0 in | Wt 239.3 lb

## 2018-04-10 DIAGNOSIS — D513 Other dietary vitamin B12 deficiency anemia: Secondary | ICD-10-CM

## 2018-04-10 DIAGNOSIS — D510 Vitamin B12 deficiency anemia due to intrinsic factor deficiency: Secondary | ICD-10-CM

## 2018-04-10 DIAGNOSIS — Z800 Family history of malignant neoplasm of digestive organs: Secondary | ICD-10-CM | POA: Insufficient documentation

## 2018-04-10 DIAGNOSIS — Z87891 Personal history of nicotine dependence: Secondary | ICD-10-CM | POA: Insufficient documentation

## 2018-04-10 DIAGNOSIS — Z9884 Bariatric surgery status: Secondary | ICD-10-CM

## 2018-04-10 DIAGNOSIS — D649 Anemia, unspecified: Secondary | ICD-10-CM | POA: Insufficient documentation

## 2018-04-10 DIAGNOSIS — E538 Deficiency of other specified B group vitamins: Secondary | ICD-10-CM | POA: Insufficient documentation

## 2018-04-10 LAB — CBC WITH DIFFERENTIAL/PLATELET
BASOS ABS: 0 10*3/uL (ref 0.0–0.1)
BASOS PCT: 0 %
Eosinophils Absolute: 0.3 10*3/uL (ref 0.0–0.5)
Eosinophils Relative: 8 %
HEMATOCRIT: 36.7 % (ref 34.8–46.6)
HEMOGLOBIN: 11.7 g/dL (ref 11.6–15.9)
Lymphocytes Relative: 40 %
Lymphs Abs: 1.4 10*3/uL (ref 0.9–3.3)
MCH: 33.1 pg (ref 25.1–34.0)
MCHC: 31.9 g/dL (ref 31.5–36.0)
MCV: 103.7 fL — ABNORMAL HIGH (ref 79.5–101.0)
MONOS PCT: 12 %
Monocytes Absolute: 0.4 10*3/uL (ref 0.1–0.9)
NEUTROS ABS: 1.4 10*3/uL — AB (ref 1.5–6.5)
NEUTROS PCT: 40 %
Platelets: 219 10*3/uL (ref 145–400)
RBC: 3.54 MIL/uL — AB (ref 3.70–5.45)
RDW: 21.7 % — ABNORMAL HIGH (ref 11.2–14.5)
WBC: 3.5 10*3/uL — AB (ref 3.9–10.3)

## 2018-04-10 LAB — RETICULOCYTES
RBC.: 3.54 MIL/uL — AB (ref 3.70–5.45)
RETIC COUNT ABSOLUTE: 24.8 10*3/uL — AB (ref 33.7–90.7)
RETIC CT PCT: 0.7 % (ref 0.7–2.1)

## 2018-04-10 LAB — SAVE SMEAR

## 2018-04-10 LAB — VITAMIN B12: Vitamin B-12: 90 pg/mL — ABNORMAL LOW (ref 180–914)

## 2018-04-10 MED ORDER — CYANOCOBALAMIN 1000 MCG/ML IJ SOLN
1000.0000 ug | Freq: Once | INTRAMUSCULAR | Status: AC
  Administered 2018-04-10: 1000 ug via INTRAMUSCULAR

## 2018-04-10 NOTE — Patient Instructions (Signed)
Cyanocobalamin, Vitamin B12 injection What is this medicine? CYANOCOBALAMIN (sye an oh koe BAL a min) is a man made form of vitamin B12. Vitamin B12 is used in the growth of healthy blood cells, nerve cells, and proteins in the body. It also helps with the metabolism of fats and carbohydrates. This medicine is used to treat people who can not absorb vitamin B12. This medicine may be used for other purposes; ask your health care provider or pharmacist if you have questions. COMMON BRAND NAME(S): B-12 Compliance Kit, B-12 Injection Kit, Cyomin, LA-12, Nutri-Twelve, Physicians EZ Use B-12, Primabalt What should I tell my health care provider before I take this medicine? They need to know if you have any of these conditions: -kidney disease -Leber's disease -megaloblastic anemia -an unusual or allergic reaction to cyanocobalamin, cobalt, other medicines, foods, dyes, or preservatives -pregnant or trying to get pregnant -breast-feeding How should I use this medicine? This medicine is injected into a muscle or deeply under the skin. It is usually given by a health care professional in a clinic or doctor's office. However, your doctor may teach you how to inject yourself. Follow all instructions. Talk to your pediatrician regarding the use of this medicine in children. Special care may be needed. Overdosage: If you think you have taken too much of this medicine contact a poison control center or emergency room at once. NOTE: This medicine is only for you. Do not share this medicine with others. What if I miss a dose? If you are given your dose at a clinic or doctor's office, call to reschedule your appointment. If you give your own injections and you miss a dose, take it as soon as you can. If it is almost time for your next dose, take only that dose. Do not take double or extra doses. What may interact with this medicine? -colchicine -heavy alcohol intake This list may not describe all possible  interactions. Give your health care provider a list of all the medicines, herbs, non-prescription drugs, or dietary supplements you use. Also tell them if you smoke, drink alcohol, or use illegal drugs. Some items may interact with your medicine. What should I watch for while using this medicine? Visit your doctor or health care professional regularly. You may need blood work done while you are taking this medicine. You may need to follow a special diet. Talk to your doctor. Limit your alcohol intake and avoid smoking to get the best benefit. What side effects may I notice from receiving this medicine? Side effects that you should report to your doctor or health care professional as soon as possible: -allergic reactions like skin rash, itching or hives, swelling of the face, lips, or tongue -blue tint to skin -chest tightness, pain -difficulty breathing, wheezing -dizziness -red, swollen painful area on the leg Side effects that usually do not require medical attention (report to your doctor or health care professional if they continue or are bothersome): -diarrhea -headache This list may not describe all possible side effects. Call your doctor for medical advice about side effects. You may report side effects to FDA at 1-800-FDA-1088. Where should I keep my medicine? Keep out of the reach of children. Store at room temperature between 15 and 30 degrees C (59 and 85 degrees F). Protect from light. Throw away any unused medicine after the expiration date. NOTE: This sheet is a summary. It may not cover all possible information. If you have questions about this medicine, talk to your doctor, pharmacist, or   health care provider.  2018 Elsevier/Gold Standard (2008-01-08 22:10:20)  

## 2018-04-11 LAB — ANTI-PARIETAL ANTIBODY: Parietal Cell Antibody-IgG: 47.5 Units — ABNORMAL HIGH (ref 0.0–20.0)

## 2018-04-11 LAB — INTRINSIC FACTOR ANTIBODIES: INTRINSIC FACTOR: 0.9 [AU]/ml (ref 0.0–1.1)

## 2018-05-09 ENCOUNTER — Inpatient Hospital Stay: Payer: Managed Care, Other (non HMO)

## 2018-05-09 VITALS — BP 130/80 | HR 70 | Temp 98.9°F | Resp 20

## 2018-05-09 DIAGNOSIS — D510 Vitamin B12 deficiency anemia due to intrinsic factor deficiency: Secondary | ICD-10-CM

## 2018-05-09 DIAGNOSIS — D513 Other dietary vitamin B12 deficiency anemia: Secondary | ICD-10-CM

## 2018-05-09 MED ORDER — CYANOCOBALAMIN 1000 MCG/ML IJ SOLN
1000.0000 ug | Freq: Once | INTRAMUSCULAR | Status: AC
  Administered 2018-05-09: 1000 ug via INTRAMUSCULAR

## 2018-05-09 MED ORDER — CYANOCOBALAMIN 1000 MCG/ML IJ SOLN
INTRAMUSCULAR | Status: AC
  Filled 2018-05-09: qty 1

## 2018-05-09 NOTE — Patient Instructions (Signed)
Cyanocobalamin, Vitamin B12 injection What is this medicine? CYANOCOBALAMIN (sye an oh koe BAL a min) is a man made form of vitamin B12. Vitamin B12 is used in the growth of healthy blood cells, nerve cells, and proteins in the body. It also helps with the metabolism of fats and carbohydrates. This medicine is used to treat people who can not absorb vitamin B12. This medicine may be used for other purposes; ask your health care provider or pharmacist if you have questions. COMMON BRAND NAME(S): B-12 Compliance Kit, B-12 Injection Kit, Cyomin, LA-12, Nutri-Twelve, Physicians EZ Use B-12, Primabalt What should I tell my health care provider before I take this medicine? They need to know if you have any of these conditions: -kidney disease -Leber's disease -megaloblastic anemia -an unusual or allergic reaction to cyanocobalamin, cobalt, other medicines, foods, dyes, or preservatives -pregnant or trying to get pregnant -breast-feeding How should I use this medicine? This medicine is injected into a muscle or deeply under the skin. It is usually given by a health care professional in a clinic or doctor's office. However, your doctor may teach you how to inject yourself. Follow all instructions. Talk to your pediatrician regarding the use of this medicine in children. Special care may be needed. Overdosage: If you think you have taken too much of this medicine contact a poison control center or emergency room at once. NOTE: This medicine is only for you. Do not share this medicine with others. What if I miss a dose? If you are given your dose at a clinic or doctor's office, call to reschedule your appointment. If you give your own injections and you miss a dose, take it as soon as you can. If it is almost time for your next dose, take only that dose. Do not take double or extra doses. What may interact with this medicine? -colchicine -heavy alcohol intake This list may not describe all possible  interactions. Give your health care provider a list of all the medicines, herbs, non-prescription drugs, or dietary supplements you use. Also tell them if you smoke, drink alcohol, or use illegal drugs. Some items may interact with your medicine. What should I watch for while using this medicine? Visit your doctor or health care professional regularly. You may need blood work done while you are taking this medicine. You may need to follow a special diet. Talk to your doctor. Limit your alcohol intake and avoid smoking to get the best benefit. What side effects may I notice from receiving this medicine? Side effects that you should report to your doctor or health care professional as soon as possible: -allergic reactions like skin rash, itching or hives, swelling of the face, lips, or tongue -blue tint to skin -chest tightness, pain -difficulty breathing, wheezing -dizziness -red, swollen painful area on the leg Side effects that usually do not require medical attention (report to your doctor or health care professional if they continue or are bothersome): -diarrhea -headache This list may not describe all possible side effects. Call your doctor for medical advice about side effects. You may report side effects to FDA at 1-800-FDA-1088. Where should I keep my medicine? Keep out of the reach of children. Store at room temperature between 15 and 30 degrees C (59 and 85 degrees F). Protect from light. Throw away any unused medicine after the expiration date. NOTE: This sheet is a summary. It may not cover all possible information. If you have questions about this medicine, talk to your doctor, pharmacist, or   health care provider.  2018 Elsevier/Gold Standard (2008-01-08 22:10:20)  

## 2018-06-06 ENCOUNTER — Inpatient Hospital Stay: Payer: Self-pay | Attending: Oncology

## 2018-06-06 MED ORDER — CYANOCOBALAMIN 1000 MCG/ML IJ SOLN
INTRAMUSCULAR | Status: AC
  Filled 2018-06-06: qty 1

## 2018-07-04 ENCOUNTER — Inpatient Hospital Stay: Payer: BLUE CROSS/BLUE SHIELD | Attending: Oncology

## 2018-07-04 MED ORDER — CYANOCOBALAMIN 1000 MCG/ML IJ SOLN
INTRAMUSCULAR | Status: AC
  Filled 2018-07-04: qty 1

## 2018-07-12 DIAGNOSIS — M25561 Pain in right knee: Secondary | ICD-10-CM | POA: Diagnosis not present

## 2018-08-01 ENCOUNTER — Inpatient Hospital Stay: Payer: BLUE CROSS/BLUE SHIELD | Attending: Oncology

## 2018-08-01 VITALS — BP 168/91 | HR 72 | Temp 98.5°F | Resp 20

## 2018-08-01 DIAGNOSIS — D510 Vitamin B12 deficiency anemia due to intrinsic factor deficiency: Secondary | ICD-10-CM

## 2018-08-01 DIAGNOSIS — D513 Other dietary vitamin B12 deficiency anemia: Secondary | ICD-10-CM

## 2018-08-01 DIAGNOSIS — E538 Deficiency of other specified B group vitamins: Secondary | ICD-10-CM | POA: Insufficient documentation

## 2018-08-01 MED ORDER — CYANOCOBALAMIN 1000 MCG/ML IJ SOLN
INTRAMUSCULAR | Status: AC
  Filled 2018-08-01: qty 1

## 2018-08-01 MED ORDER — CYANOCOBALAMIN 1000 MCG/ML IJ SOLN
1000.0000 ug | Freq: Once | INTRAMUSCULAR | Status: AC
  Administered 2018-08-01: 1000 ug via INTRAMUSCULAR

## 2018-08-01 NOTE — Patient Instructions (Signed)
Cyanocobalamin, Vitamin B12 injection What is this medicine? CYANOCOBALAMIN (sye an oh koe BAL a min) is a man made form of vitamin B12. Vitamin B12 is used in the growth of healthy blood cells, nerve cells, and proteins in the body. It also helps with the metabolism of fats and carbohydrates. This medicine is used to treat people who can not absorb vitamin B12. This medicine may be used for other purposes; ask your health care provider or pharmacist if you have questions. COMMON BRAND NAME(S): B-12 Compliance Kit, B-12 Injection Kit, Cyomin, LA-12, Nutri-Twelve, Physicians EZ Use B-12, Primabalt What should I tell my health care provider before I take this medicine? They need to know if you have any of these conditions: -kidney disease -Leber's disease -megaloblastic anemia -an unusual or allergic reaction to cyanocobalamin, cobalt, other medicines, foods, dyes, or preservatives -pregnant or trying to get pregnant -breast-feeding How should I use this medicine? This medicine is injected into a muscle or deeply under the skin. It is usually given by a health care professional in a clinic or doctor's office. However, your doctor may teach you how to inject yourself. Follow all instructions. Talk to your pediatrician regarding the use of this medicine in children. Special care may be needed. Overdosage: If you think you have taken too much of this medicine contact a poison control center or emergency room at once. NOTE: This medicine is only for you. Do not share this medicine with others. What if I miss a dose? If you are given your dose at a clinic or doctor's office, call to reschedule your appointment. If you give your own injections and you miss a dose, take it as soon as you can. If it is almost time for your next dose, take only that dose. Do not take double or extra doses. What may interact with this medicine? -colchicine -heavy alcohol intake This list may not describe all possible  interactions. Give your health care provider a list of all the medicines, herbs, non-prescription drugs, or dietary supplements you use. Also tell them if you smoke, drink alcohol, or use illegal drugs. Some items may interact with your medicine. What should I watch for while using this medicine? Visit your doctor or health care professional regularly. You may need blood work done while you are taking this medicine. You may need to follow a special diet. Talk to your doctor. Limit your alcohol intake and avoid smoking to get the best benefit. What side effects may I notice from receiving this medicine? Side effects that you should report to your doctor or health care professional as soon as possible: -allergic reactions like skin rash, itching or hives, swelling of the face, lips, or tongue -blue tint to skin -chest tightness, pain -difficulty breathing, wheezing -dizziness -red, swollen painful area on the leg Side effects that usually do not require medical attention (report to your doctor or health care professional if they continue or are bothersome): -diarrhea -headache This list may not describe all possible side effects. Call your doctor for medical advice about side effects. You may report side effects to FDA at 1-800-FDA-1088. Where should I keep my medicine? Keep out of the reach of children. Store at room temperature between 15 and 30 degrees C (59 and 85 degrees F). Protect from light. Throw away any unused medicine after the expiration date. NOTE: This sheet is a summary. It may not cover all possible information. If you have questions about this medicine, talk to your doctor, pharmacist, or   health care provider.  2018 Elsevier/Gold Standard (2008-01-08 22:10:20)  

## 2018-08-29 ENCOUNTER — Ambulatory Visit: Payer: Medicaid Other

## 2018-08-29 MED ORDER — CYANOCOBALAMIN 1000 MCG/ML IJ SOLN
INTRAMUSCULAR | Status: AC
  Filled 2018-08-29: qty 1

## 2018-09-26 ENCOUNTER — Inpatient Hospital Stay: Payer: BLUE CROSS/BLUE SHIELD | Attending: Oncology

## 2018-09-26 MED ORDER — CYANOCOBALAMIN 1000 MCG/ML IJ SOLN
INTRAMUSCULAR | Status: AC
  Filled 2018-09-26: qty 1

## 2018-10-23 NOTE — Progress Notes (Signed)
No show

## 2018-10-24 ENCOUNTER — Inpatient Hospital Stay: Payer: BLUE CROSS/BLUE SHIELD | Attending: Oncology | Admitting: Oncology

## 2018-10-24 ENCOUNTER — Inpatient Hospital Stay: Payer: BLUE CROSS/BLUE SHIELD

## 2018-10-24 ENCOUNTER — Encounter: Payer: Self-pay | Admitting: Oncology

## 2018-10-24 MED ORDER — CYANOCOBALAMIN 1000 MCG/ML IJ SOLN
INTRAMUSCULAR | Status: AC
  Filled 2018-10-24: qty 1

## 2018-11-14 DIAGNOSIS — Z1329 Encounter for screening for other suspected endocrine disorder: Secondary | ICD-10-CM | POA: Diagnosis not present

## 2018-11-14 DIAGNOSIS — N921 Excessive and frequent menstruation with irregular cycle: Secondary | ICD-10-CM | POA: Diagnosis not present

## 2018-11-14 DIAGNOSIS — Z0000 Encounter for general adult medical examination without abnormal findings: Secondary | ICD-10-CM | POA: Diagnosis not present

## 2018-11-14 DIAGNOSIS — R87610 Atypical squamous cells of undetermined significance on cytologic smear of cervix (ASC-US): Secondary | ICD-10-CM | POA: Diagnosis not present

## 2018-11-14 DIAGNOSIS — Z01419 Encounter for gynecological examination (general) (routine) without abnormal findings: Secondary | ICD-10-CM | POA: Diagnosis not present

## 2018-11-14 DIAGNOSIS — Z6841 Body Mass Index (BMI) 40.0 and over, adult: Secondary | ICD-10-CM | POA: Diagnosis not present

## 2018-11-23 DIAGNOSIS — Z1231 Encounter for screening mammogram for malignant neoplasm of breast: Secondary | ICD-10-CM | POA: Diagnosis not present

## 2018-11-23 DIAGNOSIS — N921 Excessive and frequent menstruation with irregular cycle: Secondary | ICD-10-CM | POA: Diagnosis not present

## 2019-02-20 ENCOUNTER — Telehealth: Payer: Self-pay | Admitting: *Deleted

## 2019-02-20 DIAGNOSIS — D510 Vitamin B12 deficiency anemia due to intrinsic factor deficiency: Secondary | ICD-10-CM

## 2019-02-20 DIAGNOSIS — D518 Other vitamin B12 deficiency anemias: Secondary | ICD-10-CM

## 2019-02-20 NOTE — Telephone Encounter (Signed)
Pt called to this RN to state she needs to resume B12 injections and due to no current appointments was directed to contact this RN. ( Last injection October 2019 )  Per discussion- pt verified " I do need to get these injections every month ?"  This RN verified the above as well offering a letter or note for her employer to accommodate for medical necessity.  Mary Alvarado states she has FMLA and will apply for it.  Appointment made for tomorrow for lab and injection.  Appointment request sent for additional appointments including follow up with MD ( last seen July 2019)

## 2019-02-21 ENCOUNTER — Other Ambulatory Visit: Payer: Self-pay | Admitting: Oncology

## 2019-02-21 ENCOUNTER — Other Ambulatory Visit: Payer: Self-pay

## 2019-02-21 ENCOUNTER — Inpatient Hospital Stay: Payer: BLUE CROSS/BLUE SHIELD

## 2019-02-21 ENCOUNTER — Inpatient Hospital Stay: Payer: BLUE CROSS/BLUE SHIELD | Attending: Oncology

## 2019-02-21 VITALS — BP 152/96 | HR 75 | Temp 98.3°F | Resp 18

## 2019-02-21 DIAGNOSIS — D649 Anemia, unspecified: Secondary | ICD-10-CM | POA: Insufficient documentation

## 2019-02-21 DIAGNOSIS — D513 Other dietary vitamin B12 deficiency anemia: Secondary | ICD-10-CM

## 2019-02-21 DIAGNOSIS — E538 Deficiency of other specified B group vitamins: Secondary | ICD-10-CM | POA: Insufficient documentation

## 2019-02-21 DIAGNOSIS — D510 Vitamin B12 deficiency anemia due to intrinsic factor deficiency: Secondary | ICD-10-CM

## 2019-02-21 DIAGNOSIS — D518 Other vitamin B12 deficiency anemias: Secondary | ICD-10-CM

## 2019-02-21 LAB — CBC WITH DIFFERENTIAL (CANCER CENTER ONLY)
Abs Immature Granulocytes: 0.01 10*3/uL (ref 0.00–0.07)
Basophils Absolute: 0 10*3/uL (ref 0.0–0.1)
Basophils Relative: 0 %
Eosinophils Absolute: 0.3 10*3/uL (ref 0.0–0.5)
Eosinophils Relative: 9 %
HCT: 38.8 % (ref 36.0–46.0)
Hemoglobin: 12.6 g/dL (ref 12.0–15.0)
Immature Granulocytes: 0 %
Lymphocytes Relative: 39 %
Lymphs Abs: 1.3 10*3/uL (ref 0.7–4.0)
MCH: 35.1 pg — ABNORMAL HIGH (ref 26.0–34.0)
MCHC: 32.5 g/dL (ref 30.0–36.0)
MCV: 108.1 fL — ABNORMAL HIGH (ref 80.0–100.0)
Monocytes Absolute: 0.5 10*3/uL (ref 0.1–1.0)
Monocytes Relative: 15 %
Neutro Abs: 1.2 10*3/uL — ABNORMAL LOW (ref 1.7–7.7)
Neutrophils Relative %: 37 %
Platelet Count: 216 10*3/uL (ref 150–400)
RBC: 3.59 MIL/uL — ABNORMAL LOW (ref 3.87–5.11)
RDW: 14.6 % (ref 11.5–15.5)
WBC Count: 3.2 10*3/uL — ABNORMAL LOW (ref 4.0–10.5)
nRBC: 0 % (ref 0.0–0.2)

## 2019-02-21 LAB — RETICULOCYTES
Immature Retic Fract: 13 % (ref 2.3–15.9)
RBC.: 3.59 MIL/uL — ABNORMAL LOW (ref 3.87–5.11)
Retic Count, Absolute: 40.9 10*3/uL (ref 19.0–186.0)
Retic Ct Pct: 1.1 % (ref 0.4–3.1)

## 2019-02-21 LAB — VITAMIN B12: Vitamin B-12: 105 pg/mL — ABNORMAL LOW (ref 180–914)

## 2019-02-21 MED ORDER — CYANOCOBALAMIN 1000 MCG/ML IJ SOLN
INTRAMUSCULAR | Status: AC
  Filled 2019-02-21: qty 1

## 2019-02-21 MED ORDER — CYANOCOBALAMIN 1000 MCG/ML IJ SOLN
1000.0000 ug | Freq: Once | INTRAMUSCULAR | Status: AC
  Administered 2019-02-21: 1000 ug via INTRAMUSCULAR

## 2019-02-21 NOTE — Patient Instructions (Signed)
Cyanocobalamin, Vitamin B12 injection What is this medicine? CYANOCOBALAMIN (sye an oh koe BAL a min) is a man made form of vitamin B12. Vitamin B12 is used in the growth of healthy blood cells, nerve cells, and proteins in the body. It also helps with the metabolism of fats and carbohydrates. This medicine is used to treat people who can not absorb vitamin B12. This medicine may be used for other purposes; ask your health care provider or pharmacist if you have questions. COMMON BRAND NAME(S): B-12 Compliance Kit, B-12 Injection Kit, Cyomin, LA-12, Nutri-Twelve, Physicians EZ Use B-12, Primabalt What should I tell my health care provider before I take this medicine? They need to know if you have any of these conditions: -kidney disease -Leber's disease -megaloblastic anemia -an unusual or allergic reaction to cyanocobalamin, cobalt, other medicines, foods, dyes, or preservatives -pregnant or trying to get pregnant -breast-feeding How should I use this medicine? This medicine is injected into a muscle or deeply under the skin. It is usually given by a health care professional in a clinic or doctor's office. However, your doctor may teach you how to inject yourself. Follow all instructions. Talk to your pediatrician regarding the use of this medicine in children. Special care may be needed. Overdosage: If you think you have taken too much of this medicine contact a poison control center or emergency room at once. NOTE: This medicine is only for you. Do not share this medicine with others. What if I miss a dose? If you are given your dose at a clinic or doctor's office, call to reschedule your appointment. If you give your own injections and you miss a dose, take it as soon as you can. If it is almost time for your next dose, take only that dose. Do not take double or extra doses. What may interact with this medicine? -colchicine -heavy alcohol intake This list may not describe all possible  interactions. Give your health care provider a list of all the medicines, herbs, non-prescription drugs, or dietary supplements you use. Also tell them if you smoke, drink alcohol, or use illegal drugs. Some items may interact with your medicine. What should I watch for while using this medicine? Visit your doctor or health care professional regularly. You may need blood work done while you are taking this medicine. You may need to follow a special diet. Talk to your doctor. Limit your alcohol intake and avoid smoking to get the best benefit. What side effects may I notice from receiving this medicine? Side effects that you should report to your doctor or health care professional as soon as possible: -allergic reactions like skin rash, itching or hives, swelling of the face, lips, or tongue -blue tint to skin -chest tightness, pain -difficulty breathing, wheezing -dizziness -red, swollen painful area on the leg Side effects that usually do not require medical attention (report to your doctor or health care professional if they continue or are bothersome): -diarrhea -headache This list may not describe all possible side effects. Call your doctor for medical advice about side effects. You may report side effects to FDA at 1-800-FDA-1088. Where should I keep my medicine? Keep out of the reach of children. Store at room temperature between 15 and 30 degrees C (59 and 85 degrees F). Protect from light. Throw away any unused medicine after the expiration date. NOTE: This sheet is a summary. It may not cover all possible information. If you have questions about this medicine, talk to your doctor, pharmacist, or   health care provider.  2019 Elsevier/Gold Standard (2008-01-08 22:10:20)  

## 2019-03-22 ENCOUNTER — Other Ambulatory Visit: Payer: Self-pay

## 2019-03-22 DIAGNOSIS — D510 Vitamin B12 deficiency anemia due to intrinsic factor deficiency: Secondary | ICD-10-CM

## 2019-03-23 ENCOUNTER — Other Ambulatory Visit: Payer: Self-pay

## 2019-03-23 ENCOUNTER — Ambulatory Visit: Payer: Medicaid Other

## 2019-03-23 ENCOUNTER — Other Ambulatory Visit: Payer: Medicaid Other

## 2019-03-28 ENCOUNTER — Inpatient Hospital Stay: Payer: BC Managed Care – PPO

## 2019-03-28 ENCOUNTER — Inpatient Hospital Stay: Payer: BC Managed Care – PPO | Attending: Oncology

## 2019-03-28 ENCOUNTER — Other Ambulatory Visit: Payer: Self-pay

## 2019-03-28 VITALS — BP 148/88 | HR 77 | Temp 98.1°F | Resp 18

## 2019-03-28 DIAGNOSIS — D513 Other dietary vitamin B12 deficiency anemia: Secondary | ICD-10-CM

## 2019-03-28 DIAGNOSIS — E538 Deficiency of other specified B group vitamins: Secondary | ICD-10-CM | POA: Insufficient documentation

## 2019-03-28 DIAGNOSIS — D510 Vitamin B12 deficiency anemia due to intrinsic factor deficiency: Secondary | ICD-10-CM

## 2019-03-28 DIAGNOSIS — D649 Anemia, unspecified: Secondary | ICD-10-CM | POA: Insufficient documentation

## 2019-03-28 LAB — CMP (CANCER CENTER ONLY)
ALT: 8 U/L (ref 0–44)
AST: 16 U/L (ref 15–41)
Albumin: 3.7 g/dL (ref 3.5–5.0)
Alkaline Phosphatase: 73 U/L (ref 38–126)
Anion gap: 7 (ref 5–15)
BUN: 13 mg/dL (ref 6–20)
CO2: 26 mmol/L (ref 22–32)
Calcium: 8.2 mg/dL — ABNORMAL LOW (ref 8.9–10.3)
Chloride: 108 mmol/L (ref 98–111)
Creatinine: 0.85 mg/dL (ref 0.44–1.00)
GFR, Est AFR Am: >60 mL/min (ref >60)
GFR, Estimated: >60 mL/min (ref >60)
Glucose, Bld: 89 mg/dL (ref 70–99)
Potassium: 3.8 mmol/L (ref 3.5–5.1)
Sodium: 141 mmol/L (ref 135–145)
Total Bilirubin: 0.5 mg/dL (ref 0.3–1.2)
Total Protein: 6.8 g/dL (ref 6.5–8.1)

## 2019-03-28 LAB — CBC WITH DIFFERENTIAL (CANCER CENTER ONLY)
Abs Immature Granulocytes: 0 10*3/uL (ref 0.00–0.07)
Basophils Absolute: 0 10*3/uL (ref 0.0–0.1)
Basophils Relative: 1 %
Eosinophils Absolute: 0.3 10*3/uL (ref 0.0–0.5)
Eosinophils Relative: 7 %
HCT: 40 % (ref 36.0–46.0)
Hemoglobin: 12.6 g/dL (ref 12.0–15.0)
Immature Granulocytes: 0 %
Lymphocytes Relative: 38 %
Lymphs Abs: 1.5 10*3/uL (ref 0.7–4.0)
MCH: 32.6 pg (ref 26.0–34.0)
MCHC: 31.5 g/dL (ref 30.0–36.0)
MCV: 103.6 fL — ABNORMAL HIGH (ref 80.0–100.0)
Monocytes Absolute: 0.6 10*3/uL (ref 0.1–1.0)
Monocytes Relative: 16 %
Neutro Abs: 1.5 10*3/uL — ABNORMAL LOW (ref 1.7–7.7)
Neutrophils Relative %: 38 %
Platelet Count: 256 10*3/uL (ref 150–400)
RBC: 3.86 MIL/uL — ABNORMAL LOW (ref 3.87–5.11)
RDW: 15 % (ref 11.5–15.5)
WBC Count: 3.9 10*3/uL — ABNORMAL LOW (ref 4.0–10.5)
nRBC: 0 % (ref 0.0–0.2)

## 2019-03-28 MED ORDER — CYANOCOBALAMIN 1000 MCG/ML IJ SOLN
1000.0000 ug | Freq: Once | INTRAMUSCULAR | Status: AC
  Administered 2019-03-28: 1000 ug via INTRAMUSCULAR

## 2019-03-28 MED ORDER — CYANOCOBALAMIN 1000 MCG/ML IJ SOLN
INTRAMUSCULAR | Status: AC
  Filled 2019-03-28: qty 1

## 2019-04-20 ENCOUNTER — Other Ambulatory Visit: Payer: Self-pay | Admitting: *Deleted

## 2019-04-20 DIAGNOSIS — D510 Vitamin B12 deficiency anemia due to intrinsic factor deficiency: Secondary | ICD-10-CM

## 2019-04-23 ENCOUNTER — Ambulatory Visit: Payer: Medicaid Other

## 2019-04-23 ENCOUNTER — Other Ambulatory Visit: Payer: Medicaid Other

## 2019-05-15 DIAGNOSIS — F4323 Adjustment disorder with mixed anxiety and depressed mood: Secondary | ICD-10-CM | POA: Diagnosis not present

## 2019-05-23 NOTE — Progress Notes (Signed)
[facility]  [phone] [phone]) [phone]   ID: Mary Alvarado DOB: 06/08/1976  MR#: [id number]  JYN#:[id number]#:[id number]  Patient Care Team: Patient, No Pcp Per as PCP - General (General Practice) Aurorah Schlachter, Valentino HueGustav C, MD as Consulting Physician (Oncology) Olivia Mackieaavon, Richard, MD as Consulting Physician (Obstetrics and Gynecology) Maxie Betterousins, Sheronette, MD as Consulting Physician (Obstetrics and Gynecology) OTHER MD:  CHIEF COMPLAINT: B12 deficiency  CURRENT TREATMENT: B12 supplementation   INTERVAL HISTORY: Mary Alvarado returns today for follow up of her B12 deficiency. She was last seen in the clinic on 04/10/2018.  Her most recent B12 dose here was on 03/28/2019.   REVIEW OF SYSTEMS: Mary Alvarado reports she works at [employer] and is sitting most of the day. She states she got her mammogram this year at [facility]. We do not have a copy.  She is exercising by doing some videos at home with her daughter.  A detailed review of systems was otherwise stable.     HISTORY OF CURRENT ILLNESS: From the original intake note:  The patient presented to the emergency room 02/21/2018 complaining of discomfort and swelling in her left lower leg.  Doppler ultrasonography was obtained and showed no evidence of deep vein thrombosis on either leg.  However there was a cystic structure in the popliteal fossa, likely consistent with a Baker's cyst.  In the course of admission the patient was found to be anemic, with a hemoglobin of 9.7, MCV 124, white cell count 3.1, and platelets are 90,000.  We have lab work from June 23, 2014, when the patient had a white cell count of 6.8, hemoglobin of 12, MCV 92.1 and platelets 297,000.  Additional work-up for anemia on 02/21/2018 showed a B12 level of 27, folate 11.1, ferritin 64, TIBC 246, saturation 33%, and reticulocyte count 46.4.  Her subsequent history is as detailed below.   PAST MEDICAL HISTORY: Past Medical History:   Diagnosis Date  . Anemia     PAST SURGICAL HISTORY: Past Surgical History:  Procedure Laterality Date  . GASTRIC BYPASS    . KNEE ARTHROSCOPY    . SHOULDER ARTHROSCOPY    . TUBAL LIGATION    Endometrial polyp removal 11/26/2011    FAMILY HISTORY Family History  Problem Relation Age of Onset  . Diabetes Mother   . Hypertension Mother   The patient's father is alive at age 45. The patient's mother is alive at  4763. The patient has 5 brothers and 2 sisters. She denies a family with blood disorders. She reports 2 maternal uncles with pancreatic cancer. There was a paternal aunt that passed away from stomach cancer. She denies a family history of breast or ovarian cancer.    GYNECOLOGIC HISTORY:  No LMP recorded. Menarche: 43  years old Age at first live birth: 43  years old She is GXP2 LMP: regular that lasting 3 days. She has 2 heavy days.    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 She is single. The patient's oldest is son [name] age 45 who is looking for jobs and expecting a child in August 2019. The patient's youngest is daughter [name] age 45 in the 11th grade and has a child. The patient will have 2 grandchildren.    ADVANCED DIRECTIVES:    HEALTH MAINTENANCE: Social History   Tobacco Use  . Smoking status: Former Games developermoker  . Smokeless tobacco: Never Used  Substance Use Topics  . Alcohol use: No  . Drug use: No  Colonoscopy: n/a  PAP:   Bone density: n/a   No Known Allergies  Current Outpatient Medications  Medication Sig Dispense Refill  . ibuprofen (ADVIL,MOTRIN) 600 MG tablet Take 1 tablet (600 mg total) by mouth every 8 (eight) hours as needed. Take with food. 20 tablet 0  . ibuprofen (ADVIL,MOTRIN) 800 MG tablet Take 1 tablet (800 mg total) by mouth every 8 (eight) hours as needed for mild pain or moderate pain. 21 tablet 0  . IRON PO Take 1 tablet by mouth daily.    . Prenatal Vit-Fe Fumarate-FA (PRENATAL MULTIVITAMIN) TABS  tablet Take 1 tablet by mouth daily at 12 noon.    . traMADol (ULTRAM) 50 MG tablet Take 1 tablet (50 mg total) by mouth every 6 (six) hours as needed. 15 tablet 0   No current facility-administered medications for this visit.     OBJECTIVE: Middle-aged African-American woman who appears stated age  17:   05/24/19 0940  BP: (!) 153/98  Pulse: 63  Resp: 18  Temp: 98.3 F (36.8 C)     Body mass index is 40.44 kg/m.   Wt Readings from Last 3 Encounters:  05/24/19 235 lb 9.6 oz (106.9 kg)  04/10/18 239 lb 4.8 oz (108.5 kg)  02/21/18 239 lb (108.4 kg)      ECOG FS: 1  Sclerae unicteric, EOMs intact Wearing a mask No cervical or supraclavicular adenopathy Lungs no rales or rhonchi Heart regular rate and rhythm Abd soft, nontender, positive bowel sounds MSK no focal spinal tenderness, no upper extremity lymphedema Neuro: nonfocal, well oriented, appropriate affect Breasts: Deferred   LAB RESULTS:  CMP     Component Value Date/Time   NA 141 03/28/2019 0840   K 3.8 03/28/2019 0840   CL 108 03/28/2019 0840   CO2 26 03/28/2019 0840   GLUCOSE 89 03/28/2019 0840   BUN 13 03/28/2019 0840   CREATININE 0.85 03/28/2019 0840   CALCIUM 8.2 (L) 03/28/2019 0840   PROT 6.8 03/28/2019 0840   ALBUMIN 3.7 03/28/2019 0840   AST 16 03/28/2019 0840   ALT 8 03/28/2019 0840   ALKPHOS 73 03/28/2019 0840   BILITOT 0.5 03/28/2019 0840   GFRNONAA >60 03/28/2019 0840   GFRAA >60 03/28/2019 0840    No results found for: TOTALPROTELP, ALBUMINELP, A1GS, A2GS, BETS, BETA2SER, GAMS, MSPIKE, SPEI  No results found for: KPAFRELGTCHN, LAMBDASER, KAPLAMBRATIO  Lab Results  Component Value Date   WBC 4.1 05/24/2019   NEUTROABS 1.6 (L) 05/24/2019   HGB 12.4 05/24/2019   HCT 39.2 05/24/2019   MCV 91.2 05/24/2019   PLT 282 05/24/2019    @LASTCHEMISTRY @  No results found for: LABCA2  No components found for: BLTJQZ009  No results for input(s): INR in the last 168 hours.  No  results found for: LABCA2  No results found for: QZR007  No results found for: MAU633  No results found for: HLK562  No results found for: CA2729  No components found for: HGQUANT  No results found for: CEA1 / No results found for: CEA1   No results found for: AFPTUMOR  No results found for: CHROMOGRNA  No results found for: PSA1  Appointment on 05/24/2019  Component Date Value Ref Range Status  . WBC Count 05/24/2019 4.1  4.0 - 10.5 K/uL Final  . RBC 05/24/2019 4.30  3.87 - 5.11 MIL/uL Final  . Hemoglobin 05/24/2019 12.4  12.0 - 15.0 g/dL Final  . HCT 05/24/2019 39.2  36.0 - 46.0 % Final  . MCV  05/24/2019 91.2  80.0 - 100.0 fL Final  . MCH 05/24/2019 28.8  26.0 - 34.0 pg Final  . MCHC 05/24/2019 31.6  30.0 - 36.0 g/dL Final  . RDW 65/78/469608/13/2020 15.4  11.5 - 15.5 % Final  . Platelet Count 05/24/2019 282  150 - 400 K/uL Final  . nRBC 05/24/2019 0.0  0.0 - 0.2 % Final  . Neutrophils Relative % 05/24/2019 39  % Final  . Neutro Abs 05/24/2019 1.6* 1.7 - 7.7 K/uL Final  . Lymphocytes Relative 05/24/2019 35  % Final  . Lymphs Abs 05/24/2019 1.4  0.7 - 4.0 K/uL Final  . Monocytes Relative 05/24/2019 16  % Final  . Monocytes Absolute 05/24/2019 0.7  0.1 - 1.0 K/uL Final  . Eosinophils Relative 05/24/2019 9  % Final  . Eosinophils Absolute 05/24/2019 0.4  0.0 - 0.5 K/uL Final  . Basophils Relative 05/24/2019 1  % Final  . Basophils Absolute 05/24/2019 0.0  0.0 - 0.1 K/uL Final  . Immature Granulocytes 05/24/2019 0  % Final  . Abs Immature Granulocytes 05/24/2019 0.00  0.00 - 0.07 K/uL Final   Performed at [facility], [address] [place], [place] [address]  . Retic Ct Pct 05/24/2019 1.0  0.4 - 3.1 % Final  . RBC. 05/24/2019 4.19  3.87 - 5.11 MIL/uL Final  . Retic Count, Absolute 05/24/2019 40.2  19.0 - 186.0 K/uL Final  . Immature Retic Fract 05/24/2019 14.5  2.3 - 15.9 % Final   Performed at [facility], [address] [place], [place] [address]    (this displays the last labs from the last 3 days)  No results found for: TOTALPROTELP, ALBUMINELP, A1GS, A2GS, BETS, BETA2SER, GAMS, MSPIKE, SPEI (this displays SPEP labs)  No results found for: KPAFRELGTCHN, LAMBDASER, KAPLAMBRATIO (kappa/lambda light chains)  No results found for: HGBA, HGBA2QUANT, HGBFQUANT, HGBSQUAN (Hemoglobinopathy evaluation)   No results found for: LDH  Lab Results  Component Value Date   IRON 81 02/21/2018   TIBC 246 (L) 02/21/2018   IRONPCTSAT 33 (H) 02/21/2018   (Iron and TIBC)  Lab Results  Component Value Date   FERRITIN 64 02/21/2018    Urinalysis    Component Value Date/Time   COLORURINE YELLOW 06/23/2014 0716   APPEARANCEUR CLEAR 06/23/2014 0716   LABSPEC 1.011 06/23/2014 0716   PHURINE 7.0 06/23/2014 0716   GLUCOSEU NEGATIVE 06/23/2014 0716   HGBUR NEGATIVE 06/23/2014 0716   BILIRUBINUR NEGATIVE 06/23/2014 0716   KETONESUR 15 (A) 06/23/2014 0716   PROTEINUR NEGATIVE 06/23/2014 0716   UROBILINOGEN 1.0 06/23/2014 0716   NITRITE NEGATIVE 06/23/2014 0716   LEUKOCYTESUR NEGATIVE 06/23/2014 0716     STUDIES: No results found.  ELIGIBLE FOR AVAILABLE RESEARCH PROTOCOL:   ASSESSMENT: 43 y.o. [place] woman with a history of gastric bypass presenting May 2019 with severe B12 deficiency  (1) intrinsic factor normal but antiparietal cell antibodies +04/10/2018  (2) monthly B12 supplementation started 04/10/2018  PLAN: I reviewed the reason for her B12 deficiency, namely difficulty absorbing oral B12.  She understands that if she does not continue B12 replacement parenterally she will eventually deplete her Depo as she did before and become again anemic.  Furthermore B12 deficiency if severe can result in neurologic damage.  She needs to be on B12 shots indefinitely.  She can administer them to herself or she can have them through her primary care physician or she can have them here.  At present she  prefers to  receive them here so we will continue that.  She will follow-up with her gynecologist sometime in the spring she tells me.  She will see me again in 1 year.  She knows to call for any other issue that may develop before then.   Jakyrie Totherow, Valentino HueGustav C, MD  05/24/19 10:14 AM Medical Oncology and Hematology [facility] [address] [place], [place] [address] Tel. [phone]    Fax. [phone]   I, Mickie BailKatie Daubenspeck, am acting as scribe for Dr. Valentino HueGustav C. Angellica Maddison.  I, Ruthann CancerGustav Ruchama Kubicek MD, have reviewed the above documentation for accuracy and completeness, and I agree with the above.

## 2019-05-24 ENCOUNTER — Inpatient Hospital Stay (HOSPITAL_BASED_OUTPATIENT_CLINIC_OR_DEPARTMENT_OTHER): Payer: BC Managed Care – PPO | Admitting: Oncology

## 2019-05-24 ENCOUNTER — Inpatient Hospital Stay: Payer: BC Managed Care – PPO

## 2019-05-24 ENCOUNTER — Inpatient Hospital Stay: Payer: BC Managed Care – PPO | Attending: Oncology

## 2019-05-24 ENCOUNTER — Other Ambulatory Visit: Payer: Self-pay

## 2019-05-24 VITALS — BP 153/98 | HR 63 | Temp 98.3°F | Resp 18 | Wt 235.6 lb

## 2019-05-24 DIAGNOSIS — D510 Vitamin B12 deficiency anemia due to intrinsic factor deficiency: Secondary | ICD-10-CM

## 2019-05-24 DIAGNOSIS — Z87891 Personal history of nicotine dependence: Secondary | ICD-10-CM | POA: Insufficient documentation

## 2019-05-24 DIAGNOSIS — Z791 Long term (current) use of non-steroidal anti-inflammatories (NSAID): Secondary | ICD-10-CM | POA: Diagnosis not present

## 2019-05-24 DIAGNOSIS — D649 Anemia, unspecified: Secondary | ICD-10-CM | POA: Insufficient documentation

## 2019-05-24 DIAGNOSIS — E538 Deficiency of other specified B group vitamins: Secondary | ICD-10-CM | POA: Insufficient documentation

## 2019-05-24 DIAGNOSIS — Z79899 Other long term (current) drug therapy: Secondary | ICD-10-CM | POA: Insufficient documentation

## 2019-05-24 DIAGNOSIS — Z9884 Bariatric surgery status: Secondary | ICD-10-CM | POA: Diagnosis not present

## 2019-05-24 DIAGNOSIS — D518 Other vitamin B12 deficiency anemias: Secondary | ICD-10-CM

## 2019-05-24 DIAGNOSIS — D513 Other dietary vitamin B12 deficiency anemia: Secondary | ICD-10-CM

## 2019-05-24 LAB — RETICULOCYTES
Immature Retic Fract: 14.5 % (ref 2.3–15.9)
RBC.: 4.19 MIL/uL (ref 3.87–5.11)
Retic Count, Absolute: 40.2 10*3/uL (ref 19.0–186.0)
Retic Ct Pct: 1 % (ref 0.4–3.1)

## 2019-05-24 LAB — CBC WITH DIFFERENTIAL (CANCER CENTER ONLY)
Abs Immature Granulocytes: 0 10*3/uL (ref 0.00–0.07)
Basophils Absolute: 0 10*3/uL (ref 0.0–0.1)
Basophils Relative: 1 %
Eosinophils Absolute: 0.4 10*3/uL (ref 0.0–0.5)
Eosinophils Relative: 9 %
HCT: 39.2 % (ref 36.0–46.0)
Hemoglobin: 12.4 g/dL (ref 12.0–15.0)
Immature Granulocytes: 0 %
Lymphocytes Relative: 35 %
Lymphs Abs: 1.4 10*3/uL (ref 0.7–4.0)
MCH: 28.8 pg (ref 26.0–34.0)
MCHC: 31.6 g/dL (ref 30.0–36.0)
MCV: 91.2 fL (ref 80.0–100.0)
Monocytes Absolute: 0.7 10*3/uL (ref 0.1–1.0)
Monocytes Relative: 16 %
Neutro Abs: 1.6 10*3/uL — ABNORMAL LOW (ref 1.7–7.7)
Neutrophils Relative %: 39 %
Platelet Count: 282 10*3/uL (ref 150–400)
RBC: 4.3 MIL/uL (ref 3.87–5.11)
RDW: 15.4 % (ref 11.5–15.5)
WBC Count: 4.1 10*3/uL (ref 4.0–10.5)
nRBC: 0 % (ref 0.0–0.2)

## 2019-05-24 LAB — CMP (CANCER CENTER ONLY)
ALT: 12 U/L (ref 0–44)
AST: 17 U/L (ref 15–41)
Albumin: 3.4 g/dL — ABNORMAL LOW (ref 3.5–5.0)
Alkaline Phosphatase: 68 U/L (ref 38–126)
Anion gap: 6 (ref 5–15)
BUN: 15 mg/dL (ref 6–20)
CO2: 23 mmol/L (ref 22–32)
Calcium: 8.4 mg/dL — ABNORMAL LOW (ref 8.9–10.3)
Chloride: 109 mmol/L (ref 98–111)
Creatinine: 0.9 mg/dL (ref 0.44–1.00)
GFR, Est AFR Am: >60 mL/min (ref >60)
GFR, Estimated: >60 mL/min (ref >60)
Glucose, Bld: 92 mg/dL (ref 70–99)
Potassium: 4.3 mmol/L (ref 3.5–5.1)
Sodium: 138 mmol/L (ref 135–145)
Total Bilirubin: 0.6 mg/dL (ref 0.3–1.2)
Total Protein: 6.5 g/dL (ref 6.5–8.1)

## 2019-05-24 LAB — VITAMIN B12: Vitamin B-12: 224 pg/mL (ref 180–914)

## 2019-05-24 MED ORDER — CYANOCOBALAMIN 1000 MCG/ML IJ SOLN
1000.0000 ug | Freq: Once | INTRAMUSCULAR | Status: AC
  Administered 2019-05-24: 11:00:00 1000 ug via INTRAMUSCULAR

## 2019-05-24 NOTE — Patient Instructions (Signed)
Cyanocobalamin, Vitamin B12 injection What is this medicine? CYANOCOBALAMIN (sye an oh koe BAL a min) is a man made form of vitamin B12. Vitamin B12 is used in the growth of healthy blood cells, nerve cells, and proteins in the body. It also helps with the metabolism of fats and carbohydrates. This medicine is used to treat people who can not absorb vitamin B12. This medicine may be used for other purposes; ask your health care provider or pharmacist if you have questions. COMMON BRAND NAME(S): B-12 Compliance Kit, B-12 Injection Kit, Cyomin, LA-12, Nutri-Twelve, Physicians EZ Use B-12, Primabalt What should I tell my health care provider before I take this medicine? They need to know if you have any of these conditions: -kidney disease -Leber's disease -megaloblastic anemia -an unusual or allergic reaction to cyanocobalamin, cobalt, other medicines, foods, dyes, or preservatives -pregnant or trying to get pregnant -breast-feeding How should I use this medicine? This medicine is injected into a muscle or deeply under the skin. It is usually given by a health care professional in a clinic or doctor's office. However, your doctor may teach you how to inject yourself. Follow all instructions. Talk to your pediatrician regarding the use of this medicine in children. Special care may be needed. Overdosage: If you think you have taken too much of this medicine contact a poison control center or emergency room at once. NOTE: This medicine is only for you. Do not share this medicine with others. What if I miss a dose? If you are given your dose at a clinic or doctor's office, call to reschedule your appointment. If you give your own injections and you miss a dose, take it as soon as you can. If it is almost time for your next dose, take only that dose. Do not take double or extra doses. What may interact with this medicine? -colchicine -heavy alcohol intake This list may not describe all possible  interactions. Give your health care provider a list of all the medicines, herbs, non-prescription drugs, or dietary supplements you use. Also tell them if you smoke, drink alcohol, or use illegal drugs. Some items may interact with your medicine. What should I watch for while using this medicine? Visit your doctor or health care professional regularly. You may need blood work done while you are taking this medicine. You may need to follow a special diet. Talk to your doctor. Limit your alcohol intake and avoid smoking to get the best benefit. What side effects may I notice from receiving this medicine? Side effects that you should report to your doctor or health care professional as soon as possible: -allergic reactions like skin rash, itching or hives, swelling of the face, lips, or tongue -blue tint to skin -chest tightness, pain -difficulty breathing, wheezing -dizziness -red, swollen painful area on the leg Side effects that usually do not require medical attention (report to your doctor or health care professional if they continue or are bothersome): -diarrhea -headache This list may not describe all possible side effects. Call your doctor for medical advice about side effects. You may report side effects to FDA at 1-800-FDA-1088. Where should I keep my medicine? Keep out of the reach of children. Store at room temperature between 15 and 30 degrees C (59 and 85 degrees F). Protect from light. Throw away any unused medicine after the expiration date. NOTE: This sheet is a summary. It may not cover all possible information. If you have questions about this medicine, talk to your doctor, pharmacist, or   health care provider.  2018 Elsevier/Gold Standard (2008-01-08 22:10:20)  

## 2019-06-08 DIAGNOSIS — F4323 Adjustment disorder with mixed anxiety and depressed mood: Secondary | ICD-10-CM | POA: Diagnosis not present

## 2019-06-14 DIAGNOSIS — F4321 Adjustment disorder with depressed mood: Secondary | ICD-10-CM | POA: Diagnosis not present

## 2019-06-14 DIAGNOSIS — F410 Panic disorder [episodic paroxysmal anxiety] without agoraphobia: Secondary | ICD-10-CM | POA: Diagnosis not present

## 2019-06-22 ENCOUNTER — Other Ambulatory Visit: Payer: Self-pay | Admitting: *Deleted

## 2019-06-22 DIAGNOSIS — D510 Vitamin B12 deficiency anemia due to intrinsic factor deficiency: Secondary | ICD-10-CM

## 2019-06-25 ENCOUNTER — Inpatient Hospital Stay: Payer: BC Managed Care – PPO | Attending: Oncology

## 2019-06-25 ENCOUNTER — Inpatient Hospital Stay: Payer: BC Managed Care – PPO

## 2019-06-25 ENCOUNTER — Telehealth: Payer: Self-pay | Admitting: Oncology

## 2019-06-25 DIAGNOSIS — D649 Anemia, unspecified: Secondary | ICD-10-CM | POA: Insufficient documentation

## 2019-06-25 DIAGNOSIS — E538 Deficiency of other specified B group vitamins: Secondary | ICD-10-CM | POA: Insufficient documentation

## 2019-06-25 NOTE — Telephone Encounter (Signed)
Returned patient's phone call regarding rescheduling 09/14 appointment, per patient's request appointment has been moved to 09/15.

## 2019-06-26 ENCOUNTER — Other Ambulatory Visit: Payer: Self-pay

## 2019-06-26 ENCOUNTER — Encounter (INDEPENDENT_AMBULATORY_CARE_PROVIDER_SITE_OTHER): Payer: Self-pay

## 2019-06-26 ENCOUNTER — Inpatient Hospital Stay: Payer: BC Managed Care – PPO

## 2019-06-26 VITALS — BP 152/84 | HR 69 | Temp 98.5°F | Resp 18

## 2019-06-26 DIAGNOSIS — E538 Deficiency of other specified B group vitamins: Secondary | ICD-10-CM | POA: Diagnosis not present

## 2019-06-26 DIAGNOSIS — D649 Anemia, unspecified: Secondary | ICD-10-CM | POA: Diagnosis not present

## 2019-06-26 DIAGNOSIS — D510 Vitamin B12 deficiency anemia due to intrinsic factor deficiency: Secondary | ICD-10-CM

## 2019-06-26 DIAGNOSIS — D513 Other dietary vitamin B12 deficiency anemia: Secondary | ICD-10-CM

## 2019-06-26 LAB — CBC WITH DIFFERENTIAL (CANCER CENTER ONLY)
Abs Immature Granulocytes: 0.01 10*3/uL (ref 0.00–0.07)
Basophils Absolute: 0 10*3/uL (ref 0.0–0.1)
Basophils Relative: 1 %
Eosinophils Absolute: 0.3 10*3/uL (ref 0.0–0.5)
Eosinophils Relative: 7 %
HCT: 37.9 % (ref 36.0–46.0)
Hemoglobin: 12.1 g/dL (ref 12.0–15.0)
Immature Granulocytes: 0 %
Lymphocytes Relative: 34 %
Lymphs Abs: 1.6 10*3/uL (ref 0.7–4.0)
MCH: 28.5 pg (ref 26.0–34.0)
MCHC: 31.9 g/dL (ref 30.0–36.0)
MCV: 89.4 fL (ref 80.0–100.0)
Monocytes Absolute: 0.7 10*3/uL (ref 0.1–1.0)
Monocytes Relative: 16 %
Neutro Abs: 1.9 10*3/uL (ref 1.7–7.7)
Neutrophils Relative %: 42 %
Platelet Count: 244 10*3/uL (ref 150–400)
RBC: 4.24 MIL/uL (ref 3.87–5.11)
RDW: 16 % — ABNORMAL HIGH (ref 11.5–15.5)
WBC Count: 4.6 10*3/uL (ref 4.0–10.5)
nRBC: 0 % (ref 0.0–0.2)

## 2019-06-26 MED ORDER — CYANOCOBALAMIN 1000 MCG/ML IJ SOLN
1000.0000 ug | Freq: Once | INTRAMUSCULAR | Status: AC
  Administered 2019-06-26: 1000 ug via INTRAMUSCULAR

## 2019-06-26 MED ORDER — CYANOCOBALAMIN 1000 MCG/ML IJ SOLN
INTRAMUSCULAR | Status: AC
  Filled 2019-06-26: qty 1

## 2019-06-26 NOTE — Patient Instructions (Signed)
Cyanocobalamin, Pyridoxine, and Folate What is this medicine? A multivitamin containing folic acid, vitamin B6, and vitamin B12. This medicine may be used for other purposes; ask your health care provider or pharmacist if you have questions. COMMON BRAND NAME(S): AllanFol RX, AllanTex, Av-Vite FB, B Complex with Folic Acid, ComBgen, FaBB, Folamin, Folastin, Folbalin, Folbee, Folbic, Folcaps, Folgard, Folgard RX, Folgard RX 2.2, Folplex, Folplex 2.2, Foltabs 800, Foltx, Homocysteine Formula, Niva-Fol, NuFol, TL Gard RX, Virt-Gard, Virt-Vite, Virt-Vite Forte, Vita-Respa What should I tell my health care provider before I take this medicine? They need to know if you have any of these conditions:  bleeding or clotting disorder  history of anemia of any type  other chronic health condition  an unusual or allergic reaction to vitamins, other medicines, foods, dyes, or preservatives  pregnant or trying to get pregnant  breast-feeding How should I use this medicine? Take by mouth with a glass of water. May take with food. Follow the directions on the prescription label. It is usually given once a day. Do not take your medicine more often than directed. Contact your pediatrician regarding the use of this medicine in children. Special care may be needed. Overdosage: If you think you have taken too much of this medicine contact a poison control center or emergency room at once. NOTE: This medicine is only for you. Do not share this medicine with others. What if I miss a dose? If you miss a dose, take it as soon as you can. If it is almost time for your next dose, take only that dose. Do not take double or extra doses. What may interact with this medicine?  levodopa This list may not describe all possible interactions. Give your health care provider a list of all the medicines, herbs, non-prescription drugs, or dietary supplements you use. Also tell them if you smoke, drink alcohol, or use illegal  drugs. Some items may interact with your medicine. What should I watch for while using this medicine? See your health care professional for regular checks on your progress. Remember that vitamin supplements do not replace the need for good nutrition from a balanced diet. What side effects may I notice from receiving this medicine? Side effects that you should report to your doctor or health care professional as soon as possible:  allergic reaction such as skin rash or difficulty breathing  vomiting Side effects that usually do not require medical attention (report to your doctor or health care professional if they continue or are bothersome):  nausea  stomach upset This list may not describe all possible side effects. Call your doctor for medical advice about side effects. You may report side effects to FDA at 1-800-FDA-1088. Where should I keep my medicine? Keep out of the reach of children. Most vitamins should be stored at controlled room temperature. Check your specific product directions. Protect from heat and moisture. Throw away any unused medicine after the expiration date. NOTE: This sheet is a summary. It may not cover all possible information. If you have questions about this medicine, talk to your doctor, pharmacist, or health care provider.  2020 Elsevier/Gold Standard (2007-11-18 00:59:55)  

## 2019-07-05 DIAGNOSIS — F4322 Adjustment disorder with anxiety: Secondary | ICD-10-CM | POA: Diagnosis not present

## 2019-07-25 ENCOUNTER — Inpatient Hospital Stay: Payer: BC Managed Care – PPO

## 2019-07-30 ENCOUNTER — Telehealth: Payer: Self-pay | Admitting: Oncology

## 2019-07-30 ENCOUNTER — Inpatient Hospital Stay: Payer: BC Managed Care – PPO | Attending: Oncology

## 2019-07-30 NOTE — Telephone Encounter (Signed)
Returned patient's phone call regarding rescheduling an appointment, patient's voicemail is full. 

## 2019-08-24 ENCOUNTER — Inpatient Hospital Stay: Payer: BC Managed Care – PPO

## 2019-08-24 ENCOUNTER — Inpatient Hospital Stay: Payer: BC Managed Care – PPO | Attending: Oncology

## 2019-09-24 ENCOUNTER — Inpatient Hospital Stay: Payer: BC Managed Care – PPO | Attending: Oncology

## 2019-09-24 ENCOUNTER — Inpatient Hospital Stay: Payer: BC Managed Care – PPO

## 2019-09-24 ENCOUNTER — Other Ambulatory Visit: Payer: Self-pay

## 2019-09-24 VITALS — BP 151/91 | HR 71 | Temp 97.8°F | Resp 18

## 2019-09-24 DIAGNOSIS — E538 Deficiency of other specified B group vitamins: Secondary | ICD-10-CM | POA: Insufficient documentation

## 2019-09-24 DIAGNOSIS — D513 Other dietary vitamin B12 deficiency anemia: Secondary | ICD-10-CM

## 2019-09-24 DIAGNOSIS — D510 Vitamin B12 deficiency anemia due to intrinsic factor deficiency: Secondary | ICD-10-CM

## 2019-09-24 DIAGNOSIS — D649 Anemia, unspecified: Secondary | ICD-10-CM | POA: Insufficient documentation

## 2019-09-24 LAB — CBC WITH DIFFERENTIAL (CANCER CENTER ONLY)
Abs Immature Granulocytes: 0.01 10*3/uL (ref 0.00–0.07)
Basophils Absolute: 0 10*3/uL (ref 0.0–0.1)
Basophils Relative: 1 %
Eosinophils Absolute: 0.4 10*3/uL (ref 0.0–0.5)
Eosinophils Relative: 8 %
HCT: 38.2 % (ref 36.0–46.0)
Hemoglobin: 11.8 g/dL — ABNORMAL LOW (ref 12.0–15.0)
Immature Granulocytes: 0 %
Lymphocytes Relative: 33 %
Lymphs Abs: 1.4 10*3/uL (ref 0.7–4.0)
MCH: 28 pg (ref 26.0–34.0)
MCHC: 30.9 g/dL (ref 30.0–36.0)
MCV: 90.5 fL (ref 80.0–100.0)
Monocytes Absolute: 0.6 10*3/uL (ref 0.1–1.0)
Monocytes Relative: 15 %
Neutro Abs: 1.8 10*3/uL (ref 1.7–7.7)
Neutrophils Relative %: 43 %
Platelet Count: 291 10*3/uL (ref 150–400)
RBC: 4.22 MIL/uL (ref 3.87–5.11)
RDW: 15.7 % — ABNORMAL HIGH (ref 11.5–15.5)
WBC Count: 4.3 10*3/uL (ref 4.0–10.5)
nRBC: 0 % (ref 0.0–0.2)

## 2019-09-24 MED ORDER — CYANOCOBALAMIN 1000 MCG/ML IJ SOLN
INTRAMUSCULAR | Status: AC
  Filled 2019-09-24: qty 1

## 2019-09-24 MED ORDER — CYANOCOBALAMIN 1000 MCG/ML IJ SOLN
1000.0000 ug | Freq: Once | INTRAMUSCULAR | Status: AC
  Administered 2019-09-24: 1000 ug via INTRAMUSCULAR

## 2019-09-24 NOTE — Patient Instructions (Signed)
Cyanocobalamin, Pyridoxine, and Folate What is this medicine? A multivitamin containing folic acid, vitamin B6, and vitamin B12. This medicine may be used for other purposes; ask your health care provider or pharmacist if you have questions. COMMON BRAND NAME(S): AllanFol RX, AllanTex, Av-Vite FB, B Complex with Folic Acid, ComBgen, FaBB, Folamin, Folastin, Folbalin, Folbee, Folbic, Folcaps, Folgard, Folgard RX, Folgard RX 2.2, Folplex, Folplex 2.2, Foltabs 800, Foltx, Homocysteine Formula, Niva-Fol, NuFol, TL Gard RX, Virt-Gard, Virt-Vite, Virt-Vite Forte, Vita-Respa What should I tell my health care provider before I take this medicine? They need to know if you have any of these conditions:  bleeding or clotting disorder  history of anemia of any type  other chronic health condition  an unusual or allergic reaction to vitamins, other medicines, foods, dyes, or preservatives  pregnant or trying to get pregnant  breast-feeding How should I use this medicine? Take by mouth with a glass of water. May take with food. Follow the directions on the prescription label. It is usually given once a day. Do not take your medicine more often than directed. Contact your pediatrician regarding the use of this medicine in children. Special care may be needed. Overdosage: If you think you have taken too much of this medicine contact a poison control center or emergency room at once. NOTE: This medicine is only for you. Do not share this medicine with others. What if I miss a dose? If you miss a dose, take it as soon as you can. If it is almost time for your next dose, take only that dose. Do not take double or extra doses. What may interact with this medicine?  levodopa This list may not describe all possible interactions. Give your health care provider a list of all the medicines, herbs, non-prescription drugs, or dietary supplements you use. Also tell them if you smoke, drink alcohol, or use illegal  drugs. Some items may interact with your medicine. What should I watch for while using this medicine? See your health care professional for regular checks on your progress. Remember that vitamin supplements do not replace the need for good nutrition from a balanced diet. What side effects may I notice from receiving this medicine? Side effects that you should report to your doctor or health care professional as soon as possible:  allergic reaction such as skin rash or difficulty breathing  vomiting Side effects that usually do not require medical attention (report to your doctor or health care professional if they continue or are bothersome):  nausea  stomach upset This list may not describe all possible side effects. Call your doctor for medical advice about side effects. You may report side effects to FDA at 1-800-FDA-1088. Where should I keep my medicine? Keep out of the reach of children. Most vitamins should be stored at controlled room temperature. Check your specific product directions. Protect from heat and moisture. Throw away any unused medicine after the expiration date. NOTE: This sheet is a summary. It may not cover all possible information. If you have questions about this medicine, talk to your doctor, pharmacist, or health care provider.  2020 Elsevier/Gold Standard (2007-11-18 00:59:55)  

## 2019-10-21 ENCOUNTER — Other Ambulatory Visit: Payer: Self-pay

## 2019-10-21 ENCOUNTER — Ambulatory Visit (HOSPITAL_COMMUNITY)
Admission: EM | Admit: 2019-10-21 | Discharge: 2019-10-21 | Disposition: A | Discharge status: Home / Self Care | Payer: BC Managed Care – PPO | Attending: Family Medicine | Admitting: Family Medicine

## 2019-10-21 ENCOUNTER — Encounter (HOSPITAL_COMMUNITY): Payer: Self-pay | Admitting: Emergency Medicine

## 2019-10-21 DIAGNOSIS — N939 Abnormal uterine and vaginal bleeding, unspecified: Secondary | ICD-10-CM

## 2019-10-21 DIAGNOSIS — N938 Other specified abnormal uterine and vaginal bleeding: Secondary | ICD-10-CM

## 2019-10-21 MED ORDER — MEDROXYPROGESTERONE ACETATE 5 MG PO TABS: 5.0000 mg | ORAL_TABLET | Freq: Every day | ORAL | 0 refills | Status: DC

## 2019-10-21 NOTE — Discharge Instructions (Addendum)
Follow up with gynecologist.

## 2019-10-21 NOTE — ED Triage Notes (Signed)
Pt here because her menstrual cycle has last 3 weeks.  Pt is unable to get into see her ob/gyn until Feb.  Pt has had an endometria ablation 7-8 years ago and they said the next step would be hysterectomy.  Pt is here today to get some relief for the bleeding until she can see her GYN.

## 2019-10-23 NOTE — ED Provider Notes (Signed)
Mary Alvarado    CSN: [id number] Arrival date & time: 10/21/19  1113      History   Chief Complaint Chief Complaint  Patient presents with  . Vaginal Bleeding    HPI DINISHA Alvarado is a 44 y.o. female.   Patient has a history of dysfunctional uterine bleeding.  Had an endometrial ablation 7 to 8 years ago and she was told by her gynecologist that the next step would be hysterectomy.  She is here today to get some relief for bleeding that she has had for the past 3 weeks.  She also has a history of anemia and is has had partial gastrectomy.  She receives B12 as well as iron shots monthly for this problem  HPI  Past Medical History:  Diagnosis Date  . Anemia     Patient Active Problem List   Diagnosis Date Noted  . Pernicious anemia 02/24/2018  . B12 deficiency anemia 02/24/2018  . Vitamin B12 deficiency anemia due to intrinsic factor deficiency 02/24/2018    Past Surgical History:  Procedure Laterality Date  . GASTRIC BYPASS    . KNEE ARTHROSCOPY    . SHOULDER ARTHROSCOPY    . TUBAL LIGATION      OB History   No obstetric history on file.      Home Medications    Prior to Admission medications   Medication Sig Start Date End Date Taking? Authorizing Provider  Prenatal Vit-Fe Fumarate-FA (PRENATAL MULTIVITAMIN) TABS tablet Take 1 tablet by mouth daily at 12 noon.   Yes [provider]  ibuprofen (ADVIL,MOTRIN) 800 MG tablet Take 1 tablet (800 mg total) by mouth every 8 (eight) hours as needed for mild pain or moderate pain. 06/23/14   Everlene Balls, MD  IRON PO Take 1 tablet by mouth daily.    [provider]  medroxyPROGESTERone (PROVERA) 5 MG tablet Take 1 tablet (5 mg total) by mouth daily. 10/21/19   Wardell Honour, MD  traMADol (ULTRAM) 50 MG tablet Take 1 tablet (50 mg total) by mouth every 6 (six) hours as needed. 02/21/18   Lajean Saver, MD    Family History Family History  Problem Relation Age of Onset  . Diabetes Mother    . Hypertension Mother     Social History Social History   Tobacco Use  . Smoking status: Former Research scientist (life sciences)  . Smokeless tobacco: Never Used  Substance Use Topics  . Alcohol use: No  . Drug use: No     Allergies   Patient has no known allergies.   Review of Systems Review of Systems  Genitourinary: Positive for vaginal bleeding.  All other systems reviewed and are negative.    Physical Exam Triage Vital Signs ED Triage Vitals  Enc Vitals Group     BP 10/21/19 1250 (!) 171/90     Pulse Rate 10/21/19 1250 66     Resp 10/21/19 1250 20     Temp 10/21/19 1250 98.8 F (37.1 C)     Temp Source 10/21/19 1250 Oral     SpO2 10/21/19 1250 100 %     Weight --      Height --      Head Circumference --      Peak Flow --      Pain Score 10/21/19 1248 0     Pain Loc --      Pain Edu? --      Excl. in [place]? --    No data found.  Updated Vital Signs BP (!) 186/111 (BP Location: Left Wrist) Comment (BP Location): standard cuff  Pulse 66   Temp 98.8 F (37.1 C) (Oral)   Resp 20   LMP 10/01/2019 (Exact Date)   SpO2 100%   Visual Acuity Right Eye Distance:   Left Eye Distance:   Bilateral Distance:    Right Eye Near:   Left Eye Near:    Bilateral Near:     Physical Exam Vitals and nursing note reviewed.  Constitutional:      Appearance: Normal appearance. She is obese.  Cardiovascular:     Rate and Rhythm: Normal rate and regular rhythm.  Pulmonary:     Effort: Pulmonary effort is normal.     Breath sounds: Normal breath sounds.  Genitourinary:    Comments: Declined exam Neurological:     Mental Status: She is alert.  Psychiatric:        Mood and Affect: Mood normal.        Behavior: Behavior normal.      UC Treatments / Results  Labs (all labs ordered are listed, but only abnormal results are displayed) Labs Reviewed - No data to display  EKG   Radiology No results found.  Procedures Procedures (including critical care time)  Medications  Ordered in UC Medications - No data to display  Initial Impression / Assessment and Plan / UC Course  I have reviewed the triage vital signs and the nursing notes.  Pertinent labs & imaging results that were available during my care of the patient were reviewed by me and considered in my medical decision making (see chart for details).     Menorrhagia.  Will stop bleeding hopefully with use of progesterone.  Have explained that bleeding should stop within more normal.  Should ensue.  Also instructed to follow-up with GYN Final Clinical Impressions(s) / UC Diagnoses   Final diagnoses:  Vaginal bleeding     Discharge Instructions     Follow-up with gynecologist   ED Prescriptions    Medication Sig Dispense Auth. Provider   medroxyPROGESTERone (PROVERA) 5 MG tablet Take 1 tablet (5 mg total) by mouth daily. 5 tablet Frederica Kuster, MD     PDMP not reviewed this encounter.   Frederica Kuster, MD 10/23/19 [phone]

## 2019-10-24 ENCOUNTER — Telehealth (HOSPITAL_COMMUNITY): Payer: Self-pay | Admitting: Family Medicine

## 2019-10-24 MED ORDER — MEGESTROL ACETATE 40 MG PO TABS: 40.0000 mg | ORAL_TABLET | Freq: Every day | ORAL | 0 refills | Status: AC

## 2019-10-24 NOTE — Telephone Encounter (Signed)
Per nurse, patient called. Reports Dr Hyacinth Meeker told her we could alter treatment should progesterone not work well. She requests that we do so.  Sent to pharmacy: Meds ordered this encounter  Medications  . megestrol (MEGACE) 40 MG tablet    Sig: Take 1 tablet (40 mg total) by mouth daily for 14 days.    Dispense:  14 tablet    Refill:  0

## 2019-10-25 ENCOUNTER — Inpatient Hospital Stay (HOSPITAL_BASED_OUTPATIENT_CLINIC_OR_DEPARTMENT_OTHER): Payer: BC Managed Care – PPO | Admitting: Adult Health

## 2019-10-25 ENCOUNTER — Inpatient Hospital Stay: Payer: BC Managed Care – PPO

## 2019-10-25 ENCOUNTER — Other Ambulatory Visit: Payer: Self-pay

## 2019-10-25 ENCOUNTER — Inpatient Hospital Stay: Payer: BC Managed Care – PPO | Attending: Oncology

## 2019-10-25 ENCOUNTER — Encounter: Payer: Self-pay | Admitting: Adult Health

## 2019-10-25 ENCOUNTER — Telehealth: Payer: Self-pay

## 2019-10-25 VITALS — BP 162/89 | HR 70 | Temp 98.9°F | Resp 20

## 2019-10-25 VITALS — BP 162/89 | HR 70 | Temp 98.9°F | Resp 20 | Ht 64.0 in | Wt 236.0 lb

## 2019-10-25 DIAGNOSIS — Z9884 Bariatric surgery status: Secondary | ICD-10-CM | POA: Diagnosis not present

## 2019-10-25 DIAGNOSIS — D513 Other dietary vitamin B12 deficiency anemia: Secondary | ICD-10-CM

## 2019-10-25 DIAGNOSIS — Z87891 Personal history of nicotine dependence: Secondary | ICD-10-CM | POA: Diagnosis not present

## 2019-10-25 DIAGNOSIS — D509 Iron deficiency anemia, unspecified: Secondary | ICD-10-CM

## 2019-10-25 DIAGNOSIS — D510 Vitamin B12 deficiency anemia due to intrinsic factor deficiency: Secondary | ICD-10-CM

## 2019-10-25 DIAGNOSIS — Z8249 Family history of ischemic heart disease and other diseases of the circulatory system: Secondary | ICD-10-CM | POA: Insufficient documentation

## 2019-10-25 DIAGNOSIS — Z833 Family history of diabetes mellitus: Secondary | ICD-10-CM | POA: Diagnosis not present

## 2019-10-25 LAB — CBC WITH DIFFERENTIAL (CANCER CENTER ONLY)
Abs Immature Granulocytes: 0.01 10*3/uL (ref 0.00–0.07)
Basophils Absolute: 0 10*3/uL (ref 0.0–0.1)
Basophils Relative: 1 %
Eosinophils Absolute: 0.3 10*3/uL (ref 0.0–0.5)
Eosinophils Relative: 8 %
HCT: 36.6 % (ref 36.0–46.0)
Hemoglobin: 11.6 g/dL — ABNORMAL LOW (ref 12.0–15.0)
Immature Granulocytes: 0 %
Lymphocytes Relative: 33 %
Lymphs Abs: 1.4 10*3/uL (ref 0.7–4.0)
MCH: 28.1 pg (ref 26.0–34.0)
MCHC: 31.7 g/dL (ref 30.0–36.0)
MCV: 88.6 fL (ref 80.0–100.0)
Monocytes Absolute: 0.7 10*3/uL (ref 0.1–1.0)
Monocytes Relative: 16 %
Neutro Abs: 1.8 10*3/uL (ref 1.7–7.7)
Neutrophils Relative %: 42 %
Platelet Count: 266 10*3/uL (ref 150–400)
RBC: 4.13 MIL/uL (ref 3.87–5.11)
RDW: 15.6 % — ABNORMAL HIGH (ref 11.5–15.5)
WBC Count: 4.2 10*3/uL (ref 4.0–10.5)
nRBC: 0 % (ref 0.0–0.2)

## 2019-10-25 MED ORDER — CYANOCOBALAMIN 1000 MCG/ML IJ SOLN
INTRAMUSCULAR | Status: AC
  Filled 2019-10-25: qty 1

## 2019-10-25 MED ORDER — CYANOCOBALAMIN 1000 MCG/ML IJ SOLN
1000.0000 ug | Freq: Once | INTRAMUSCULAR | Status: AC
  Administered 2019-10-25: 10:00:00 1000 ug via INTRAMUSCULAR

## 2019-10-25 NOTE — Telephone Encounter (Signed)
Nurse called [facility] to get appt scheduled for patient due to heavy bleeding.  Appt is set for 10/30/19 @ 11:15 am.  Patient is aware.

## 2019-10-25 NOTE — Progress Notes (Signed)
[facility] Follow up:    Patient, No Pcp Per No address on file   DIAGNOSIS: B12 DEFICIENCY  SUMMARY OF HEMATOLOGIC HISTORY: ASSESSMENT: 44 y.o. 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 female is here for evaluation of menorrhagia.  She has had a cycle for 18 days.  She was here for her b12 injections, and complained to the phlebotomist that her cycle will not stop.  She is tired and has reached out to her gynecologist and the earliest she could be seen was in February.  She had an ablation about 7-8 years ago that was successful until earlier this month.  She saw urgent care earlier in the week and was given progesterone tablets which didn't help.  She continues to bleed.  She is going through about 5-6 tampons per day.  She is taking a prenatal vitamin and had gastric bypass in 08/2009.   Patient Active Problem List   Diagnosis Date Noted  . Pernicious anemia 02/24/2018  . B12 deficiency anemia 02/24/2018  . Vitamin B12 deficiency anemia due to intrinsic factor deficiency 02/24/2018    has No Known Allergies.  MEDICAL HISTORY: Past Medical History:  Diagnosis Date  . Anemia     SURGICAL HISTORY: Past Surgical History:  Procedure Laterality Date  . GASTRIC BYPASS    . KNEE ARTHROSCOPY    . SHOULDER ARTHROSCOPY    . TUBAL LIGATION      SOCIAL HISTORY: Social History   Socioeconomic History  . Marital status: Single    Spouse name: Not on file  . Number of children: Not on file  . Years of education: Not on file  . Highest education level: Not on file  Occupational History  . Not on file  Tobacco Use  . Smoking status: Former Research scientist (life sciences)  . Smokeless tobacco: Never Used   Substance and Sexual Activity  . Alcohol use: No  . Drug use: No  . Sexual activity: Yes    Birth control/protection: Surgical  Other Topics Concern  . Not on file  Social History Narrative  . Not on file   Social Determinants of Health   Financial Resource Strain:   . Difficulty of Paying Living Expenses: Not on file  Food Insecurity:   . Worried About Charity fundraiser in the Last Year: Not on file  . Ran Out of Food in the Last Year: Not on file  Transportation Needs:   . Lack of Transportation (Medical): Not on file  . Lack of Transportation (Non-Medical): Not on file  Physical Activity:   . Days of Exercise per Week: Not on file  . Minutes of Exercise per Session: Not on file  Stress:   . Feeling of Stress : Not on file  Social Connections:   . Frequency of Communication with Friends and Family: Not on file  . Frequency of Social Gatherings with Friends and Family: Not on file  . Attends Religious Services: Not on file  . Active Member of Clubs or Organizations: Not on file  . Attends Archivist Meetings: Not on file  . Marital Status: Not on file  Intimate Partner Violence:   . Fear of Current or Ex-Partner: Not on file  . Emotionally Abused: Not on file  .  Physically Abused: Not on file  . Sexually Abused: Not on file    FAMILY HISTORY: Family History  Problem Relation Age of Onset  . Diabetes Mother   . Hypertension Mother     Review of Systems  Constitutional: Positive for fatigue. Negative for appetite change, chills, fever and unexpected weight change.  HENT:   Negative for hearing loss, lump/mass and trouble swallowing.   Eyes: Negative for eye problems and icterus.  Respiratory: Negative for chest tightness, cough and shortness of breath.   Cardiovascular: Negative for chest pain, leg swelling and palpitations.  Gastrointestinal: Negative for abdominal distention, abdominal pain, constipation, diarrhea, nausea and vomiting.  Endocrine:  Negative for hot flashes.  Genitourinary: Negative for difficulty urinating.   Musculoskeletal: Negative for arthralgias.  Skin: Negative for itching and rash.  Neurological: Negative for dizziness, extremity weakness, headaches and numbness.  Hematological: Negative for adenopathy. Does not bruise/bleed easily.  Psychiatric/Behavioral: Negative for depression. The patient is not nervous/anxious.       PHYSICAL EXAMINATION  ECOG PERFORMANCE STATUS: 1 - Symptomatic but completely ambulatory  Vitals:   10/25/19 1016  BP: (!) 162/89  Pulse: 70  Resp: 20  Temp: 98.9 F (37.2 C)  SpO2: 98%    Physical Exam Constitutional:      General: She is not in acute distress.    Appearance: Normal appearance. She is not toxic-appearing.  HENT:     Head: Normocephalic and atraumatic.  Eyes:     General: No scleral icterus.    Pupils: Pupils are equal, round, and reactive to light.  Cardiovascular:     Rate and Rhythm: Normal rate and regular rhythm.     Pulses: Normal pulses.     Heart sounds: Normal heart sounds.  Pulmonary:     Effort: Pulmonary effort is normal.  Abdominal:     General: Abdomen is flat.     Palpations: Abdomen is soft.  Musculoskeletal:     Cervical back: Neck supple.  Lymphadenopathy:     Cervical: No cervical adenopathy.  Skin:    General: Skin is warm and dry.     Findings: No rash.  Neurological:     General: No focal deficit present.     Mental Status: She is alert and oriented to person, place, and time.  Psychiatric:        Mood and Affect: Mood normal.        Behavior: Behavior normal.     LABORATORY DATA:  CBC    Component Value Date/Time   WBC 4.2 10/25/2019 0933   WBC 3.5 (L) 04/10/2018 1451   RBC 4.13 10/25/2019 0933   HGB 11.6 (L) 10/25/2019 0933   HCT 36.6 10/25/2019 0933   PLT 266 10/25/2019 0933   MCV 88.6 10/25/2019 0933   MCH 28.1 10/25/2019 0933   MCHC 31.7 10/25/2019 0933   RDW 15.6 (H) 10/25/2019 0933   LYMPHSABS 1.4  10/25/2019 0933   MONOABS 0.7 10/25/2019 0933   EOSABS 0.3 10/25/2019 0933   BASOSABS 0.0 10/25/2019 0933    CMP     Component Value Date/Time   NA 138 05/24/2019 0929   K 4.3 05/24/2019 0929   CL 109 05/24/2019 0929   CO2 23 05/24/2019 0929   GLUCOSE 92 05/24/2019 0929   BUN 15 05/24/2019 0929   CREATININE 0.90 05/24/2019 0929   CALCIUM 8.4 (L) 05/24/2019 0929   PROT 6.5 05/24/2019 0929   ALBUMIN 3.4 (L) 05/24/2019 0929   AST 17 05/24/2019 0929  ALT 12 05/24/2019 0929   ALKPHOS 68 05/24/2019 0929   BILITOT 0.6 05/24/2019 0929   GFRNONAA >60 05/24/2019 0929   GFRAA >60 05/24/2019 0929        ASSESSMENT and THERAPY PLAN:   Vitamin B12 deficiency anemia due to intrinsic factor deficiency Jeremiah has a h/o b12 deficiency and receives injections every 4 weeks. Today however, her main issue is not her b12 deficiency.  Her issue is heavy menses which have caused a mild anemia, and likely slight decrease in her iron levels.  This is coupled by thefact that she is taking iron in a prenatal vitamin, but she has h/o gastric bypass, so she does not absorb nutrients as well as a typical person would.  Linsy's hemglobin is 11.6 today.  This is slightly lower than last time, however I am reassured that despite her 18 day cycle, it isn't in the 8-9 range.  With Ciarrah bleeding and her bypass, I am concerned she will not be absorbing iron.  I placed orders for [place] to check iron studies with her next couple of labs.   My nurse was able to call over to [facility] gyn office and get her appointment expedited from 4 weeks away to next week.   We will see Terin back in 4 weeks for labs and her next injection.  She was recommended to continue with the appropriate pandemic precautions.    All questions were answered. The patient knows to call the clinic with any problems, questions or concerns. We can certainly see the patient much sooner if necessary.  Total time spent: 20  minutes   This note was electronically signed. Noreene Filbert, NP 10/26/2019

## 2019-10-25 NOTE — Patient Instructions (Signed)
Cyanocobalamin, Pyridoxine, and Folate What is this medicine? A multivitamin containing folic acid, vitamin B6, and vitamin B12. This medicine may be used for other purposes; ask your health care provider or pharmacist if you have questions. COMMON BRAND NAME(S): AllanFol RX, AllanTex, Av-Vite FB, B Complex with Folic Acid, ComBgen, FaBB, Folamin, Folastin, Folbalin, Folbee, Folbic, Folcaps, Folgard, Folgard RX, Folgard RX 2.2, Folplex, Folplex 2.2, Foltabs 800, Foltx, Homocysteine Formula, Niva-Fol, NuFol, TL Gard RX, Virt-Gard, Virt-Vite, Virt-Vite Forte, Vita-Respa What should I tell my health care provider before I take this medicine? They need to know if you have any of these conditions:  bleeding or clotting disorder  history of anemia of any type  other chronic health condition  an unusual or allergic reaction to vitamins, other medicines, foods, dyes, or preservatives  pregnant or trying to get pregnant  breast-feeding How should I use this medicine? Take by mouth with a glass of water. May take with food. Follow the directions on the prescription label. It is usually given once a day. Do not take your medicine more often than directed. Contact your pediatrician regarding the use of this medicine in children. Special care may be needed. Overdosage: If you think you have taken too much of this medicine contact a poison control center or emergency room at once. NOTE: This medicine is only for you. Do not share this medicine with others. What if I miss a dose? If you miss a dose, take it as soon as you can. If it is almost time for your next dose, take only that dose. Do not take double or extra doses. What may interact with this medicine?  levodopa This list may not describe all possible interactions. Give your health care provider a list of all the medicines, herbs, non-prescription drugs, or dietary supplements you use. Also tell them if you smoke, drink alcohol, or use illegal  drugs. Some items may interact with your medicine. What should I watch for while using this medicine? See your health care professional for regular checks on your progress. Remember that vitamin supplements do not replace the need for good nutrition from a balanced diet. What side effects may I notice from receiving this medicine? Side effects that you should report to your doctor or health care professional as soon as possible:  allergic reaction such as skin rash or difficulty breathing  vomiting Side effects that usually do not require medical attention (report to your doctor or health care professional if they continue or are bothersome):  nausea  stomach upset This list may not describe all possible side effects. Call your doctor for medical advice about side effects. You may report side effects to FDA at 1-800-FDA-1088. Where should I keep my medicine? Keep out of the reach of children. Most vitamins should be stored at controlled room temperature. Check your specific product directions. Protect from heat and moisture. Throw away any unused medicine after the expiration date. NOTE: This sheet is a summary. It may not cover all possible information. If you have questions about this medicine, talk to your doctor, pharmacist, or health care provider.  2020 Elsevier/Gold Standard (2007-11-18 00:59:55)  

## 2019-10-26 NOTE — Assessment & Plan Note (Signed)
Mary Alvarado has a h/o b12 deficiency and receives injections every 4 weeks. Today however, her main issue is not her b12 deficiency.  Her issue is heavy menses which have caused a mild anemia, and likely slight decrease in her iron levels.  This is coupled by thefact that she is taking iron in a prenatal vitamin, but she has h/o gastric bypass, so she does not absorb nutrients as well as a typical person would.  Mary Alvarado's hemglobin is 11.6 today.  This is slightly lower than last time, however I am reassured that despite her 18 day cycle, it isn't in the 8-9 range.  With Mary Alvarado bleeding and her bypass, I am concerned she will not be absorbing iron.  I placed orders for [place] to check iron studies with her next couple of labs.   My nurse was able to call over to Mary Alvarado's gyn office and get her appointment expedited from 4 weeks away to next week.   We will see Mary Alvarado back in 4 weeks for labs and her next injection.  She was recommended to continue with the appropriate pandemic precautions.

## 2019-10-29 DIAGNOSIS — N939 Abnormal uterine and vaginal bleeding, unspecified: Secondary | ICD-10-CM | POA: Diagnosis not present

## 2019-11-02 DIAGNOSIS — D252 Subserosal leiomyoma of uterus: Secondary | ICD-10-CM | POA: Diagnosis not present

## 2019-11-02 DIAGNOSIS — D251 Intramural leiomyoma of uterus: Secondary | ICD-10-CM | POA: Diagnosis not present

## 2019-11-02 DIAGNOSIS — N939 Abnormal uterine and vaginal bleeding, unspecified: Secondary | ICD-10-CM | POA: Diagnosis not present

## 2019-11-21 ENCOUNTER — Other Ambulatory Visit: Payer: Self-pay | Admitting: Obstetrics and Gynecology

## 2019-11-22 ENCOUNTER — Other Ambulatory Visit: Payer: Self-pay

## 2019-11-22 ENCOUNTER — Encounter (HOSPITAL_BASED_OUTPATIENT_CLINIC_OR_DEPARTMENT_OTHER): Payer: Self-pay | Admitting: Obstetrics and Gynecology

## 2019-11-22 NOTE — Progress Notes (Signed)
Spoke w/ via phone for pre-op interview---patient Lab needs dos----  no preg per MD           Lab results------cbc, bmp COVID test ------2/16 Arrive at -------0715 NPO after ------mn Medications to take morning of surgery -----none Diabetic medication -----n/a Patient Special Instructions -----n/a Pre-Op special Istructions -----n/a Patient verbalized understanding of instructions that were given at this phone interview. Patient denies shortness of breath, chest pain, fever, cough a this phone interview.

## 2019-11-26 ENCOUNTER — Encounter (HOSPITAL_COMMUNITY)
Admission: RE | Admit: 2019-11-26 | Discharge: 2019-11-26 | Disposition: A | Discharge status: Home / Self Care | Payer: BC Managed Care – PPO | Source: Ambulatory Visit | Attending: Obstetrics and Gynecology | Admitting: Obstetrics and Gynecology

## 2019-11-26 ENCOUNTER — Other Ambulatory Visit: Payer: Self-pay

## 2019-11-26 ENCOUNTER — Inpatient Hospital Stay: Payer: BC Managed Care – PPO

## 2019-11-26 ENCOUNTER — Encounter (HOSPITAL_COMMUNITY): Payer: Self-pay | Admitting: Emergency Medicine

## 2019-11-26 ENCOUNTER — Ambulatory Visit (HOSPITAL_COMMUNITY)
Admission: EM | Admit: 2019-11-26 | Discharge: 2019-11-26 | Disposition: A | Discharge status: Home / Self Care | Payer: BC Managed Care – PPO

## 2019-11-26 ENCOUNTER — Telehealth (INDEPENDENT_AMBULATORY_CARE_PROVIDER_SITE_OTHER): Payer: BC Managed Care – PPO | Admitting: Registered Nurse

## 2019-11-26 DIAGNOSIS — R030 Elevated blood-pressure reading, without diagnosis of hypertension: Secondary | ICD-10-CM

## 2019-11-26 DIAGNOSIS — I10 Essential (primary) hypertension: Secondary | ICD-10-CM | POA: Diagnosis not present

## 2019-11-26 DIAGNOSIS — Z8249 Family history of ischemic heart disease and other diseases of the circulatory system: Secondary | ICD-10-CM

## 2019-11-26 DIAGNOSIS — Z01812 Encounter for preprocedural laboratory examination: Secondary | ICD-10-CM | POA: Diagnosis not present

## 2019-11-26 LAB — BASIC METABOLIC PANEL
Anion gap: 5 (ref 5–15)
BUN: 15 mg/dL (ref 6–20)
CO2: 24 mmol/L (ref 22–32)
Calcium: 8.5 mg/dL — ABNORMAL LOW (ref 8.9–10.3)
Chloride: 111 mmol/L (ref 98–111)
Creatinine, Ser: 0.91 mg/dL (ref 0.44–1.00)
GFR calc Af Amer: >60 mL/min (ref >60)
GFR calc non Af Amer: >60 mL/min (ref >60)
Glucose, Bld: 80 mg/dL (ref 70–99)
Potassium: 4.5 mmol/L (ref 3.5–5.1)
Sodium: 140 mmol/L (ref 135–145)

## 2019-11-26 LAB — CBC
HCT: 36.7 % (ref 36.0–46.0)
Hemoglobin: 11.1 g/dL — ABNORMAL LOW (ref 12.0–15.0)
MCH: 27.4 pg (ref 26.0–34.0)
MCHC: 30.2 g/dL (ref 30.0–36.0)
MCV: 90.6 fL (ref 80.0–100.0)
Platelets: 278 10*3/uL (ref 150–400)
RBC: 4.05 MIL/uL (ref 3.87–5.11)
RDW: 14.8 % (ref 11.5–15.5)
WBC: 4.7 10*3/uL (ref 4.0–10.5)
nRBC: 0 % (ref 0.0–0.2)

## 2019-11-26 MED ORDER — CHLORTHALIDONE 25 MG PO TABS: 25.0000 mg | ORAL_TABLET | Freq: Every day | ORAL | 0 refills | Status: DC

## 2019-11-26 NOTE — Progress Notes (Signed)
Pt's blood pressure reading was 214/115 and 195/120 during her PAT appt. Pt asympotamic, and is currently not on any blood pressure medication, nor does she have a current or prior diagnosis of hypertension. Pt reports that she has noted that her blood pressure has continuously  increased' during [facility]. Pt doesnot have a PCP  Spoke with Jodell Cipro, PA regarding the above. Per Shanda Bumps, pt to go to an Urgent Care facility to see a provider. [facility] on [address] was recommended. Pt was also advised that if her diastolic reading was above 110 on the day of surgery, her surgery may be cancelled. Pt verbalized understanding.

## 2019-11-26 NOTE — ED Triage Notes (Addendum)
Pt here for HBP.  She was at preadmission for surgery and her BP was very elevated so they sent her here for further evaluation.  Pt thinks she has HBP due to the energy drinks she drinks every day. She states she drinks two a day because she goes to work at 0400-0800 and then work 0900-1700 at her daily job.  She had her last energy drink at 0340 this morning.  Pt states she only gets 2-3 hours of sleep a night, 6 nights a week, then sleeps most of the day on Sundays.

## 2019-11-26 NOTE — ED Provider Notes (Signed)
[facility]    CSN: [id number] Arrival date & time: 11/26/19  1002      History   Chief Complaint Chief Complaint  Patient presents with  . Hypertension    HPI Mary Alvarado is a 44 y.o. female.   Reports that she was getting pre-procedure workup today for a uterine ablation later this week, and states that her BP was "240ish/110ish." Reports that she has been taking Provera x 2 mos for increased uterine bleeding. Denies history of HTN, cardiac history. Reports that she has a primary care appointment for this issue at 1:30 this afternoon.   The history is provided by the patient.  Hypertension Pertinent negatives include no chest pain, no abdominal pain, no headaches and no shortness of breath.    Past Medical History:  Diagnosis Date  . Anemia     Patient Active Problem List   Diagnosis Date Noted  . Pernicious anemia 02/24/2018  . B12 deficiency anemia 02/24/2018  . Vitamin B12 deficiency anemia due to intrinsic factor deficiency 02/24/2018    Past Surgical History:  Procedure Laterality Date  . GASTRIC BYPASS  08/2009  . KNEE ARTHROSCOPY Left   . SHOULDER ARTHROSCOPY Left   . TUBAL LIGATION    . WISDOM TOOTH EXTRACTION      OB History   No obstetric history on file.      Home Medications    Prior to Admission medications   Medication Sig Start Date End Date Taking? Authorizing Provider  medroxyPROGESTERone (PROVERA) 5 MG tablet Take 1 tablet (5 mg total) by mouth daily. 10/21/19  Yes Wardell Honour, MD  Prenatal Vit-Fe Fumarate-FA (PRENATAL MULTIVITAMIN) TABS tablet Take 1 tablet by mouth daily at 12 noon.   Yes [provider]  ibuprofen (ADVIL,MOTRIN) 800 MG tablet Take 1 tablet (800 mg total) by mouth every 8 (eight) hours as needed for mild pain or moderate pain. 06/23/14   Everlene Balls, MD  IRON PO Take 1 tablet by mouth daily.    [provider]  traMADol (ULTRAM) 50 MG tablet Take 1 tablet (50 mg total) by mouth  every 6 (six) hours as needed. Patient not taking: Reported on 11/22/2019 02/21/18   Lajean Saver, MD    Family History Family History  Problem Relation Age of Onset  . Diabetes Mother   . Hypertension Mother     Social History Social History   Tobacco Use  . Smoking status: Former Smoker    Quit date: 08/22/2019    Years since quitting: 0.2  . Smokeless tobacco: Never Used  Substance Use Topics  . Alcohol use: No  . Drug use: No     Allergies   Patient has no known allergies.   Review of Systems Review of Systems  Constitutional: Positive for fatigue. Negative for chills and fever.  HENT: Negative for congestion.   Eyes: Negative for pain and visual disturbance.  Respiratory: Negative for cough and shortness of breath.   Cardiovascular: Negative for chest pain, palpitations and leg swelling.  Gastrointestinal: Negative for abdominal pain, diarrhea, nausea and vomiting.  Genitourinary: Positive for menstrual problem. Negative for dysuria and hematuria.  Musculoskeletal: Negative for arthralgias and back pain.  Skin: Negative for color change and rash.  Neurological: Negative for dizziness, seizures, syncope, light-headedness and headaches.  All other systems reviewed and are negative.    Physical Exam Triage Vital Signs ED Triage Vitals [11/26/19 1017]  Enc Vitals Group     BP (!) 164/88  Pulse Rate 71     Resp 18     Temp 98.7 F (37.1 C)     Temp Source Oral     SpO2 100 %     Weight      Height      Head Circumference      Peak Flow      Pain Score      Pain Loc      Pain Edu?      Excl. in [place]?    No data found.  Updated Vital Signs BP (!) 164/88 (BP Location: Right Arm)   Pulse 71   Temp 98.7 F (37.1 C) (Oral)   Resp 18   LMP 09/27/2019 (LMP Unknown)   SpO2 100%      Physical Exam Vitals and nursing note reviewed.  Constitutional:      General: She is not in acute distress.    Appearance: She is well-developed. She is obese.    HENT:     Head: Normocephalic and atraumatic.     Nose: Nose normal.     Mouth/Throat:     Mouth: Mucous membranes are moist.  Eyes:     Conjunctiva/sclera: Conjunctivae normal.  Cardiovascular:     Rate and Rhythm: Normal rate and regular rhythm.     Pulses: Normal pulses.     Heart sounds: No murmur.  Pulmonary:     Effort: Pulmonary effort is normal. No respiratory distress.     Breath sounds: Normal breath sounds.  Abdominal:     Palpations: Abdomen is soft.     Tenderness: There is no abdominal tenderness.  Musculoskeletal:     Cervical back: Neck supple.  Skin:    General: Skin is warm and dry.     Capillary Refill: Capillary refill takes less than 2 seconds.  Neurological:     General: No focal deficit present.     Mental Status: She is alert and oriented to person, place, and time.  Psychiatric:        Mood and Affect: Mood normal.        Behavior: Behavior normal.      UC Treatments / Results  Labs (all labs ordered are listed, but only abnormal results are displayed) Labs Reviewed - No data to display  EKG   Radiology No results found.  Procedures Procedures (including critical care time)  Medications Ordered in UC Medications - No data to display  Initial Impression / Assessment and Plan / UC Course  I have reviewed the triage vital signs and the nursing notes.  Pertinent labs & imaging results that were available during my care of the patient were reviewed by me and considered in my medical decision making (see chart for details).     Presents with elevated BP without HTN diagnosis. Instructed to keep BP log at home and present to primary care provider. Instructed to go to the ER for worst headache of her life, severe extremity or facial swelling, or changes in vision.  Final Clinical Impressions(s) / UC Diagnoses   Final diagnoses:  Elevated blood pressure reading in office with diagnosis of hypertension     Discharge Instructions      You have elevated blood pressure in the office today. It would benefit you to keep a log of your blood pressure so that you can tell if it is high all the time or only sometimes.   Establish care with a primary care provider to follow up with this.  Go to the Emergency Room if you are experiencing changes in vision, having the worst headache of your life, or other concerning symptoms.     ED Prescriptions    None     I have reviewed the PDMP during this encounter.   Moshe Cipro, NP 11/26/19 1105

## 2019-11-26 NOTE — Progress Notes (Signed)
Patient stated that her blood pressure has been extremely high. Stated she went to the doctor today and got a reading of 214/122. Suppose to be having surgery Friday so she has some concerns

## 2019-11-26 NOTE — Progress Notes (Signed)
Telemedicine Encounter- SOAP NOTE Established Patient  This telephone encounter was conducted with the patient's (or proxy's) verbal consent via audio telecommunications: yes  Patient was instructed to have this encounter in a suitably private space; and to only have persons present to whom they give permission to participate. In addition, patient identity was confirmed by use of name plus two identifiers (DOB and address).  I discussed the limitations, risks, security and privacy concerns of performing an evaluation and management service by telephone and the availability of in person appointments. I also discussed with the patient that there may be a patient responsible charge related to this service. The patient expressed understanding and agreed to proceed.  I spent a total of 11 minutes talking with the patient or their proxy.  No chief complaint on file.   Subjective   Mary Alvarado is a 44 y.o. established patient. Telephone visit today for elevated BP  HPI Pt states she is scheduled for a uterine ablation on Friday with Dr. Ronita Hipps. She has had a history of some elevated BP readings without dx of hypertension. Has been checking BP with home cuff, reports that today it was 214/122. She denies CV symptoms including chest pain, shob, doe, headache, dependent edema, visual changes. She was able to go to urgent care, where her BP was read as 164/88.  She is calling today for BP control prior to ablation. No other concerns. Has TOC visit with me on 12/10/2019  Patient Active Problem List   Diagnosis Date Noted  . Pernicious anemia 02/24/2018  . B12 deficiency anemia 02/24/2018  . Vitamin B12 deficiency anemia due to intrinsic factor deficiency 02/24/2018    Past Medical History:  Diagnosis Date  . Anemia     Current Outpatient Medications  Medication Sig Dispense Refill  . ibuprofen (ADVIL,MOTRIN) 800 MG tablet Take 1 tablet (800 mg total) by mouth every 8 (eight) hours as  needed for mild pain or moderate pain. 21 tablet 0  . IRON PO Take 1 tablet by mouth daily.    . medroxyPROGESTERone (PROVERA) 5 MG tablet Take 1 tablet (5 mg total) by mouth daily. 5 tablet 0  . Prenatal Vit-Fe Fumarate-FA (PRENATAL MULTIVITAMIN) TABS tablet Take 1 tablet by mouth daily at 12 noon.    . chlorthalidone (HYGROTON) 25 MG tablet Take 1 tablet (25 mg total) by mouth daily. 30 tablet 0  . traMADol (ULTRAM) 50 MG tablet Take 1 tablet (50 mg total) by mouth every 6 (six) hours as needed. (Patient not taking: Reported on 11/22/2019) 15 tablet 0   No current facility-administered medications for this visit.    No Known Allergies  Social History   Socioeconomic History  . Marital status: Single    Spouse name: Not on file  . Number of children: Not on file  . Years of education: Not on file  . Highest education level: Not on file  Occupational History  . Not on file  Tobacco Use  . Smoking status: Former Smoker    Quit date: 08/22/2019    Years since quitting: 0.2  . Smokeless tobacco: Never Used  Substance and Sexual Activity  . Alcohol use: No  . Drug use: No  . Sexual activity: Yes    Birth control/protection: Surgical  Other Topics Concern  . Not on file  Social History Narrative  . Not on file   Social Determinants of Health   Financial Resource Strain:   . Difficulty of Paying Living Expenses: Not  on file  Food Insecurity:   . Worried About Programme researcher, broadcasting/film/video in the Last Year: Not on file  . Ran Out of Food in the Last Year: Not on file  Transportation Needs:   . Lack of Transportation (Medical): Not on file  . Lack of Transportation (Non-Medical): Not on file  Physical Activity:   . Days of Exercise per Week: Not on file  . Minutes of Exercise per Session: Not on file  Stress:   . Feeling of Stress : Not on file  Social Connections:   . Frequency of Communication with Friends and Family: Not on file  . Frequency of Social Gatherings with Friends and  Family: Not on file  . Attends Religious Services: Not on file  . Active Member of Clubs or Organizations: Not on file  . Attends Banker Meetings: Not on file  . Marital Status: Not on file  Intimate Partner Violence:   . Fear of Current or Ex-Partner: Not on file  . Emotionally Abused: Not on file  . Physically Abused: Not on file  . Sexually Abused: Not on file    Review of Systems  Constitutional: Negative.   HENT: Negative.   Eyes: Negative.   Respiratory: Negative.   Cardiovascular: Negative.   Gastrointestinal: Negative.   Genitourinary: Negative.   Musculoskeletal: Negative.   Skin: Negative.   Neurological: Negative.   Endo/Heme/Allergies: Negative.   Psychiatric/Behavioral: Negative.   All other systems reviewed and are negative.   Objective   Vitals as reported by the patient: There were no vitals filed for this visit.  Diagnoses and all orders for this visit:  Essential hypertension -     chlorthalidone (HYGROTON) 25 MG tablet; Take 1 tablet (25 mg total) by mouth daily.   PLAN  Chlorthalidone 25mg  PO qd, take in am to avoid nocturia  166/88 at urgent care is a much more reliable and reassuring number.  She would present to our office for BP check in 48 hours, but she has to quarantine d/t covid restrictions prior to surgery. We will follow up on this on 12/10/19  Dr. 02/09/20 will be CC'd on this chart to inform him of this change  Patient encouraged to call clinic with any questions, comments, or concerns.   I discussed the assessment and treatment plan with the patient. The patient was provided an opportunity to ask questions and all were answered. The patient agreed with the plan and demonstrated an understanding of the instructions.   The patient was advised to call back or seek an in-person evaluation if the symptoms worsen or if the condition fails to improve as anticipated.  I provided 11 minutes of non-face-to-face time during this  encounter.  Billy Coast, NP  Primary Care at [facility]

## 2019-11-26 NOTE — Discharge Instructions (Signed)
You have elevated blood pressure in the office today. It would benefit you to keep a log of your blood pressure so that you can tell if it is high all the time or only sometimes.   Establish care with a primary care provider to follow up with this.   Go to the Emergency Room if you are experiencing changes in vision, having the worst headache of your life, or other concerning symptoms.

## 2019-11-27 ENCOUNTER — Other Ambulatory Visit (HOSPITAL_COMMUNITY)
Admission: RE | Admit: 2019-11-27 | Discharge: 2019-11-27 | Disposition: A | Discharge status: Home / Self Care | Payer: BC Managed Care – PPO | Source: Ambulatory Visit | Attending: Obstetrics and Gynecology | Admitting: Obstetrics and Gynecology

## 2019-11-27 DIAGNOSIS — Z01812 Encounter for preprocedural laboratory examination: Secondary | ICD-10-CM | POA: Insufficient documentation

## 2019-11-27 DIAGNOSIS — Z20822 Contact with and (suspected) exposure to covid-19: Secondary | ICD-10-CM | POA: Diagnosis not present

## 2019-11-27 LAB — SARS CORONAVIRUS 2 (TAT 6-24 HRS): SARS Coronavirus 2: NEGATIVE

## 2019-11-29 ENCOUNTER — Encounter (HOSPITAL_BASED_OUTPATIENT_CLINIC_OR_DEPARTMENT_OTHER): Payer: Self-pay | Admitting: Obstetrics and Gynecology

## 2019-11-29 NOTE — Progress Notes (Signed)
Spoke w/ via phone for pre-op interview---[facility] Lab needs dos----  Urine poct, ekg          Lab results------cbc, bmet done 11-26-2019 epic COVID test -----12-03-2019  Arrive at -------1045 am 12-06-2019 NPO after ------midnight Medications to take morning of surgery -----none Diabetic medication -----n/a Patient Special Instructions ----- Pre-Op special Istructions ----- Patient stated on blood pressure medication chlorothalidone and current blood pressure is 172/84 Patient verbalized understanding of instructions that were given at this phone interview. Patient denies shortness of breath, chest pain, fever, cough a this phone interview.

## 2019-12-03 ENCOUNTER — Other Ambulatory Visit (HOSPITAL_COMMUNITY)
Admission: RE | Admit: 2019-12-03 | Discharge: 2019-12-03 | Disposition: A | Discharge status: Home / Self Care | Payer: BC Managed Care – PPO | Source: Ambulatory Visit | Attending: Obstetrics and Gynecology | Admitting: Obstetrics and Gynecology

## 2019-12-03 ENCOUNTER — Other Ambulatory Visit (HOSPITAL_COMMUNITY): Payer: Medicaid Other

## 2019-12-03 DIAGNOSIS — Z20822 Contact with and (suspected) exposure to covid-19: Secondary | ICD-10-CM | POA: Diagnosis not present

## 2019-12-03 DIAGNOSIS — Z01812 Encounter for preprocedural laboratory examination: Secondary | ICD-10-CM | POA: Insufficient documentation

## 2019-12-03 LAB — SARS CORONAVIRUS 2 (TAT 6-24 HRS): SARS Coronavirus 2: NEGATIVE

## 2019-12-04 ENCOUNTER — Inpatient Hospital Stay: Payer: BC Managed Care – PPO

## 2019-12-04 ENCOUNTER — Inpatient Hospital Stay: Payer: BC Managed Care – PPO | Attending: Oncology

## 2019-12-06 ENCOUNTER — Ambulatory Visit (HOSPITAL_BASED_OUTPATIENT_CLINIC_OR_DEPARTMENT_OTHER): Payer: BC Managed Care – PPO | Admitting: Anesthesiology

## 2019-12-06 ENCOUNTER — Ambulatory Visit (HOSPITAL_BASED_OUTPATIENT_CLINIC_OR_DEPARTMENT_OTHER)
Admission: RE | Admit: 2019-12-06 | Discharge: 2019-12-06 | Disposition: A | Discharge status: Home / Self Care | Payer: BC Managed Care – PPO | Source: Other Acute Inpatient Hospital | Attending: Obstetrics and Gynecology | Admitting: Obstetrics and Gynecology

## 2019-12-06 ENCOUNTER — Encounter (HOSPITAL_BASED_OUTPATIENT_CLINIC_OR_DEPARTMENT_OTHER): Payer: Self-pay | Admitting: Obstetrics and Gynecology

## 2019-12-06 ENCOUNTER — Ambulatory Visit (HOSPITAL_BASED_OUTPATIENT_CLINIC_OR_DEPARTMENT_OTHER): Payer: BC Managed Care – PPO | Admitting: Physician Assistant

## 2019-12-06 ENCOUNTER — Encounter (HOSPITAL_BASED_OUTPATIENT_CLINIC_OR_DEPARTMENT_OTHER)
Admission: RE | Disposition: A | Discharge status: Home / Self Care | Payer: Self-pay | Source: Other Acute Inpatient Hospital | Attending: Obstetrics and Gynecology

## 2019-12-06 ENCOUNTER — Other Ambulatory Visit: Payer: Self-pay

## 2019-12-06 DIAGNOSIS — N920 Excessive and frequent menstruation with regular cycle: Secondary | ICD-10-CM | POA: Insufficient documentation

## 2019-12-06 DIAGNOSIS — N939 Abnormal uterine and vaginal bleeding, unspecified: Secondary | ICD-10-CM | POA: Diagnosis not present

## 2019-12-06 DIAGNOSIS — Z9884 Bariatric surgery status: Secondary | ICD-10-CM | POA: Insufficient documentation

## 2019-12-06 DIAGNOSIS — N938 Other specified abnormal uterine and vaginal bleeding: Secondary | ICD-10-CM | POA: Diagnosis not present

## 2019-12-06 DIAGNOSIS — Z9889 Other specified postprocedural states: Secondary | ICD-10-CM | POA: Insufficient documentation

## 2019-12-06 DIAGNOSIS — N840 Polyp of corpus uteri: Secondary | ICD-10-CM | POA: Insufficient documentation

## 2019-12-06 DIAGNOSIS — Z8249 Family history of ischemic heart disease and other diseases of the circulatory system: Secondary | ICD-10-CM | POA: Insufficient documentation

## 2019-12-06 DIAGNOSIS — Z87891 Personal history of nicotine dependence: Secondary | ICD-10-CM | POA: Insufficient documentation

## 2019-12-06 DIAGNOSIS — D510 Vitamin B12 deficiency anemia due to intrinsic factor deficiency: Secondary | ICD-10-CM | POA: Diagnosis not present

## 2019-12-06 DIAGNOSIS — E538 Deficiency of other specified B group vitamins: Secondary | ICD-10-CM | POA: Diagnosis not present

## 2019-12-06 DIAGNOSIS — N710 Acute inflammatory disease of uterus: Secondary | ICD-10-CM | POA: Diagnosis not present

## 2019-12-06 DIAGNOSIS — I10 Essential (primary) hypertension: Secondary | ICD-10-CM | POA: Diagnosis not present

## 2019-12-06 HISTORY — PX: DILITATION & CURRETTAGE/HYSTROSCOPY WITH HYDROTHERMAL ABLATION: SHX5570

## 2019-12-06 HISTORY — DX: Essential (primary) hypertension: I10

## 2019-12-06 SURGERY — DILATATION & CURETTAGE/HYSTEROSCOPY WITH HYDROTHERMAL ABLATION
Anesthesia: General

## 2019-12-06 MED ORDER — LACTATED RINGERS IV SOLN
INTRAVENOUS | Status: DC
  Administered 2019-12-06: 1000 mL via INTRAVENOUS
  Filled 2019-12-06: qty 1000

## 2019-12-06 MED ORDER — ACETAMINOPHEN 325 MG PO TABS
325.0000 mg | ORAL_TABLET | ORAL | Status: DC | PRN
  Filled 2019-12-06: qty 2

## 2019-12-06 MED ORDER — HYDRALAZINE HCL 20 MG/ML IJ SOLN
5.0000 mg | Freq: Once | INTRAMUSCULAR | Status: AC
  Administered 2019-12-06: 5 mg via INTRAVENOUS
  Filled 2019-12-06: qty 0.25

## 2019-12-06 MED ORDER — MEPERIDINE HCL 25 MG/ML IJ SOLN
6.2500 mg | INTRAMUSCULAR | Status: DC | PRN
  Filled 2019-12-06: qty 1

## 2019-12-06 MED ORDER — ACETAMINOPHEN 160 MG/5ML PO SOLN
325.0000 mg | ORAL | Status: DC | PRN
  Filled 2019-12-06: qty 20.3

## 2019-12-06 MED ORDER — PROPOFOL 10 MG/ML IV BOLUS
INTRAVENOUS | Status: AC
  Filled 2019-12-06: qty 40

## 2019-12-06 MED ORDER — PROPOFOL 10 MG/ML IV BOLUS
INTRAVENOUS | Status: DC | PRN
  Administered 2019-12-06: 200 mg via INTRAVENOUS

## 2019-12-06 MED ORDER — DEXAMETHASONE SODIUM PHOSPHATE 10 MG/ML IJ SOLN
INTRAMUSCULAR | Status: AC
  Filled 2019-12-06: qty 1

## 2019-12-06 MED ORDER — HYDRALAZINE HCL 20 MG/ML IJ SOLN
INTRAMUSCULAR | Status: AC
  Filled 2019-12-06: qty 1

## 2019-12-06 MED ORDER — OXYCODONE HCL 5 MG/5ML PO SOLN
5.0000 mg | Freq: Once | ORAL | Status: DC | PRN
  Filled 2019-12-06: qty 5

## 2019-12-06 MED ORDER — FENTANYL CITRATE (PF) 100 MCG/2ML IJ SOLN
INTRAMUSCULAR | Status: AC
  Filled 2019-12-06: qty 2

## 2019-12-06 MED ORDER — TRAMADOL HCL 50 MG PO TABS: 50.0000 mg | ORAL_TABLET | Freq: Four times a day (QID) | ORAL | 0 refills | Status: DC | PRN

## 2019-12-06 MED ORDER — CEFAZOLIN SODIUM-DEXTROSE 2-4 GM/100ML-% IV SOLN
INTRAVENOUS | Status: AC
  Filled 2019-12-06: qty 100

## 2019-12-06 MED ORDER — MIDAZOLAM HCL 2 MG/2ML IJ SOLN
INTRAMUSCULAR | Status: DC | PRN
  Administered 2019-12-06: 2 mg via INTRAVENOUS

## 2019-12-06 MED ORDER — FENTANYL CITRATE (PF) 100 MCG/2ML IJ SOLN
25.0000 ug | INTRAMUSCULAR | Status: DC | PRN
  Administered 2019-12-06 (×3): 25 ug via INTRAVENOUS
  Filled 2019-12-06: qty 1

## 2019-12-06 MED ORDER — SODIUM CHLORIDE 0.9 % IR SOLN
Status: DC | PRN
  Administered 2019-12-06: 3000 mL

## 2019-12-06 MED ORDER — ONDANSETRON HCL 4 MG/2ML IJ SOLN
INTRAMUSCULAR | Status: AC
  Filled 2019-12-06: qty 2

## 2019-12-06 MED ORDER — KETOROLAC TROMETHAMINE 30 MG/ML IJ SOLN
30.0000 mg | Freq: Once | INTRAMUSCULAR | Status: DC | PRN
  Filled 2019-12-06: qty 1

## 2019-12-06 MED ORDER — ONDANSETRON HCL 4 MG/2ML IJ SOLN
INTRAMUSCULAR | Status: DC | PRN
  Administered 2019-12-06: 4 mg via INTRAVENOUS

## 2019-12-06 MED ORDER — KETOROLAC TROMETHAMINE 30 MG/ML IJ SOLN
INTRAMUSCULAR | Status: DC | PRN
  Administered 2019-12-06: 30 mg via INTRAVENOUS

## 2019-12-06 MED ORDER — ONDANSETRON HCL 4 MG/2ML IJ SOLN
4.0000 mg | Freq: Once | INTRAMUSCULAR | Status: DC | PRN
  Filled 2019-12-06: qty 2

## 2019-12-06 MED ORDER — FENTANYL CITRATE (PF) 100 MCG/2ML IJ SOLN
INTRAMUSCULAR | Status: DC | PRN
  Administered 2019-12-06 (×2): 50 ug via INTRAVENOUS

## 2019-12-06 MED ORDER — ROCURONIUM BROMIDE 10 MG/ML (PF) SYRINGE
PREFILLED_SYRINGE | INTRAVENOUS | Status: AC
  Filled 2019-12-06: qty 10

## 2019-12-06 MED ORDER — CEFAZOLIN SODIUM-DEXTROSE 2-4 GM/100ML-% IV SOLN
2.0000 g | INTRAVENOUS | Status: AC
  Administered 2019-12-06: 2 g via INTRAVENOUS
  Filled 2019-12-06: qty 100

## 2019-12-06 MED ORDER — KETOROLAC TROMETHAMINE 30 MG/ML IJ SOLN
INTRAMUSCULAR | Status: AC
  Filled 2019-12-06: qty 1

## 2019-12-06 MED ORDER — BUPIVACAINE HCL (PF) 0.25 % IJ SOLN
INTRAMUSCULAR | Status: DC | PRN
  Administered 2019-12-06: 20 mL

## 2019-12-06 MED ORDER — LIDOCAINE 2% (20 MG/ML) 5 ML SYRINGE
INTRAMUSCULAR | Status: AC
  Filled 2019-12-06: qty 5

## 2019-12-06 MED ORDER — DEXAMETHASONE SODIUM PHOSPHATE 10 MG/ML IJ SOLN
INTRAMUSCULAR | Status: DC | PRN
  Administered 2019-12-06: 8 mg via INTRAVENOUS

## 2019-12-06 MED ORDER — LIDOCAINE 2% (20 MG/ML) 5 ML SYRINGE
INTRAMUSCULAR | Status: DC | PRN
  Administered 2019-12-06: 100 mg via INTRAVENOUS

## 2019-12-06 MED ORDER — MIDAZOLAM HCL 2 MG/2ML IJ SOLN
INTRAMUSCULAR | Status: AC
  Filled 2019-12-06: qty 2

## 2019-12-06 MED ORDER — OXYCODONE HCL 5 MG PO TABS
5.0000 mg | ORAL_TABLET | Freq: Once | ORAL | Status: DC | PRN
  Filled 2019-12-06: qty 1

## 2019-12-06 SURGICAL SUPPLY — 16 items
CATH ROBINSON RED A/P 16FR (CATHETERS) ×2 IMPLANT
COVER WAND RF STERILE (DRAPES) ×2 IMPLANT
ELECT REM PT RETURN 9FT ADLT (ELECTROSURGICAL)
ELECTRODE REM PT RTRN 9FT ADLT (ELECTROSURGICAL) IMPLANT
GLOVE BIO SURGEON STRL SZ7.5 (GLOVE) ×2 IMPLANT
GLOVE BIOGEL PI IND STRL 7.0 (GLOVE) ×1 IMPLANT
GLOVE BIOGEL PI INDICATOR 7.0 (GLOVE) ×1
GOWN STRL REUS W/ TWL LRG LVL3 (GOWN DISPOSABLE) ×3 IMPLANT
GOWN STRL REUS W/TWL LRG LVL3 (GOWN DISPOSABLE) ×6
HIBICLENS CHG 4% 4OZ (MISCELLANEOUS) ×2 IMPLANT
IV NS IRRIG 3000ML ARTHROMATIC (IV SOLUTION) ×2 IMPLANT
NS IRRIG 1000ML POUR BTL (IV SOLUTION) ×2 IMPLANT
PACK VAGINAL MINOR WOMEN LF (CUSTOM PROCEDURE TRAY) ×2 IMPLANT
PAD OB MATERNITY 4.3X12.25 (PERSONAL CARE ITEMS) ×2 IMPLANT
SET GENESYS HTA PROCERVA (MISCELLANEOUS) ×2 IMPLANT
TOWEL OR 17X26 10 PK STRL BLUE (TOWEL DISPOSABLE) ×2 IMPLANT

## 2019-12-06 NOTE — Anesthesia Procedure Notes (Addendum)
Procedure Name: LMA Insertion Date/Time: 12/06/2019 12:20 PM Performed by: Norva Pavlov, CRNA Pre-anesthesia Checklist: Patient identified, Emergency Drugs available, Suction available and Patient being monitored Patient Re-evaluated:Patient Re-evaluated prior to induction Oxygen Delivery Method: Circle system utilized Preoxygenation: Pre-oxygenation with 100% oxygen Induction Type: IV induction Ventilation: Mask ventilation without difficulty LMA: LMA inserted LMA Size: 4.0 Number of attempts: 1 Airway Equipment and Method: Bite block Placement Confirmation: positive ETCO2 Tube secured with: Tape Dental Injury: Teeth and Oropharynx as per pre-operative assessment

## 2019-12-06 NOTE — Anesthesia Preprocedure Evaluation (Signed)
Anesthesia Evaluation  Patient identified by MRN, date of birth, ID band Patient awake    Reviewed: Allergy & Precautions, H&P , NPO status , Patient's Chart, lab work & pertinent test results  Airway Mallampati: II  TM Distance: >3 FB Neck ROM: Full    Dental no notable dental hx. (+) Teeth Intact   Pulmonary neg pulmonary ROS, former smoker,    Pulmonary exam normal breath sounds clear to auscultation       Cardiovascular hypertension, Pt. on medications Normal cardiovascular exam Rhythm:Regular Rate:Normal     Neuro/Psych negative neurological ROS  negative psych ROS   GI/Hepatic negative GI ROS, Neg liver ROS, S/P Gastric Bypass Surgery   Endo/Other  negative endocrine ROS  Renal/GU negative Renal ROS  negative genitourinary   Musculoskeletal negative musculoskeletal ROS (+)   Abdominal (+) + obese,   Peds  Hematology   Anesthesia Other Findings   Reproductive/Obstetrics negative OB ROS                             Anesthesia Physical  Anesthesia Plan  ASA: II  Anesthesia Plan: General   Post-op Pain Management:    Induction: Intravenous  PONV Risk Score and Plan: Ondansetron, Dexamethasone and Midazolam  Airway Management Planned: LMA  Additional Equipment: None  Intra-op Plan:   Post-operative Plan: Extubation in OR  Informed Consent: I have reviewed the patients History and Physical, chart, labs and discussed the procedure including the risks, benefits and alternatives for the proposed anesthesia with the patient or authorized representative who has indicated his/her understanding and acceptance.     Dental advisory given  Plan Discussed with: CRNA  Anesthesia Plan Comments:         Anesthesia Quick Evaluation

## 2019-12-06 NOTE — H&P (Signed)
Mary Alvarado is an 44 y.o. female. Failed EAB for HTA ablation  Pertinent Gynecological History: Menses: flow is moderate Bleeding: dysfunctional uterine bleeding Contraception: none DES exposure: denies Blood transfusions: none Sexually transmitted diseases: no past history Previous GYN Procedures: DNC  Last mammogram: normal Date: 2020 Last pap: normal Date: 2020 OB History: G2, P2   Menstrual History: Menarche age: 33 Patient's last menstrual period was 09/27/2019 (lmp unknown).    Past Medical History:  Diagnosis Date   Anemia    Hypertension     Past Surgical History:  Procedure Laterality Date   GASTRIC BYPASS  08/2009   KNEE ARTHROSCOPY Left    SHOULDER ARTHROSCOPY Left    TUBAL LIGATION     WISDOM TOOTH EXTRACTION      Family History  Problem Relation Age of Onset   Diabetes Mother    Hypertension Mother     Social History:  reports that she quit smoking about 3 months ago. She has never used smokeless tobacco. She reports that she does not drink alcohol or use drugs.  Allergies: No Known Allergies  No medications prior to admission.    Review of Systems  Constitutional: Negative.   All other systems reviewed and are negative.   Height 5\' 4"  (1.626 m), weight 104.3 kg, last menstrual period 09/27/2019. Physical Exam  Nursing note and vitals reviewed. Constitutional: She is oriented to person, place, and time. She appears well-developed and well-nourished.  HENT:  Head: Normocephalic and atraumatic.  Cardiovascular: Normal rate and regular rhythm.  Respiratory: Effort normal and breath sounds normal.  GI: Soft. Bowel sounds are normal.  Genitourinary:    Vagina and uterus normal.   Musculoskeletal:        General: Normal range of motion.     Cervical back: Normal range of motion and neck supple.  Neurological: She is alert and oriented to person, place, and time. She has normal reflexes.  Skin: Skin is warm and dry.  Psychiatric: She has  a normal mood and affect.    No results found for this or any previous visit (from the past 24 hour(s)).  No results found.  Assessment/Plan: Menorrhagia- failed Novasure HTA ablation Risks vs benefits of surgery discussed. Risks of anesthesia, infection, bleeding discussed. Rpt EAB and poss failure or inability to perform procedure noted. Consent done.  Hania Cerone J 12/06/2019, 6:52 AM

## 2019-12-06 NOTE — Op Note (Signed)
NAME: Mary Alvarado, Mary Alvarado MEDICAL RECORD GO:[id number] ACCOUNT [id number] DATE OF BIRTH:21-Jan-1976 FACILITY: [facility] LOCATION: [facility] PHYSICIAN:Daryana Whirley J. Sukhman Martine, MD  OPERATIVE REPORT  DATE OF PROCEDURE:  12/06/2019  PREOPERATIVE DIAGNOSIS:  Abnormal uterine bleeding with menorrhagia and failed previous NovaSure endometrial ablation.  POSTOPERATIVE DIAGNOSES:  Abnormal uterine bleeding with menorrhagia and failed previous NovaSure endometrial ablation, endometrial polyp.  PROCEDURE:  Diagnostic hysteroscopy, dilatation and curettage, endometrial polypectomy, hydrothermal, HTA ablation.  SURGEON:  Olivia Mackie, MD  ASSISTANT:  None  ANESTHESIA:  General.  ESTIMATED BLOOD LOSS:  Less than 50 mL.  FLUID DEFICIT:  Less than 50 mL.  COMPLICATIONS:  None.  DRAINS:  None.  COUNTS:  Correct.  DISPOSITION:  The patient was taken to recovery in good condition.  BRIEF OPERATIVE NOTE:  After being apprised of the risks of anesthesia, infection, bleeding, and surrounding organs, possible need for repair, delayed versus immediate complications including bowel, bladder, injury, possible need for repair, the patient  was brought to the operating room where she was administered general anesthetic without complications.  Prepped and draped in usual sterile fashion.  Exam under anesthesia was a bulky 8-10 week size uterus, no adnexal masses appreciated.  Cervix was  easily dilated up to 24 Pratt dilator.  Visualization hysteroscopically reveals a polypoid mass and an otherwise small deviated to the left endometrial cavity, tubal ostia not visualized.  Scarring noted due to previous ablation.  No evidence of  perforation.  HTA seal is assured.  HTA fluid heating pattern proceeds in a normal fashion.  After appropriate heating ablation starts and continues for approximately 6 minutes before closing.  Procedure closed.  Visualization reveals a well ablated  endometrial cavity.  No evidence of  perforation.  Fluid deficit as noted.  Minimal bleeding appreciated.  All instruments were removed.  The patient tolerated the procedure well, was awakened and transferred to recovery in good condition.  CN/NUANCE  D:12/06/2019 T:12/06/2019 JOB:[id number]

## 2019-12-06 NOTE — Progress Notes (Signed)
Dr Arby Barrette reviewed BP's no orders given.

## 2019-12-06 NOTE — Op Note (Signed)
12/06/2019  1:03 PM  PATIENT:  Mary Alvarado  43 y.o. female  PRE-OPERATIVE DIAGNOSIS:  Abnormal Uterine Bleeding Failed EAB POST-OPERATIVE DIAGNOSIS:  Abnormal Uterine Bleeding Failed EAB Endometrial polyp  PROCEDURE:  Procedure(s): DILATATION & CURETTAGE HYSTEROSCOPY WITH HYDROTHERMAL ABLATION ENDOMETRIAL POLYPECTOMY  SURGEON:  Surgeon(s): Olivia Mackie, MD  ASSISTANTS: none   ANESTHESIA:   local and general  ESTIMATED BLOOD LOSS: 10 mL   DRAINS: none   LOCAL MEDICATIONS USED:  MARCAINE     SPECIMEN:  Source of Specimen:  EMC AND POLYP  DISPOSITION OF SPECIMEN:  PATHOLOGY  COUNTS:  YES  DICTATION #: [id number]  PLAN OF CARE: DC HOME  PATIENT DISPOSITION:  PACU - hemodynamically stable.

## 2019-12-06 NOTE — Discharge Instructions (Signed)
°  Post Anesthesia Home Care Instructions ° °Activity: °Get plenty of rest for the remainder of the day. A responsible individual must stay with you for 24 hours following the procedure.  °For the next 24 hours, DO NOT: °-Drive a car °-Operate machinery °-Drink alcoholic beverages °-Take any medication unless instructed by your physician °-Make any legal decisions or sign important papers. ° °Meals: °Start with liquid foods such as gelatin or soup. Progress to regular foods as tolerated. Avoid greasy, spicy, heavy foods. If nausea and/or vomiting occur, drink only clear liquids until the nausea and/or vomiting subsides. Call your physician if vomiting continues. ° °Special Instructions/Symptoms: °Your throat may feel dry or sore from the anesthesia or the breathing tube placed in your throat during surgery. If this causes discomfort, gargle with warm salt water. The discomfort should disappear within 24 hours. ° °If you had a scopolamine patch placed behind your ear for the management of post- operative nausea and/or vomiting: ° °1. The medication in the patch is effective for 72 hours, after which it should be removed.  Wrap patch in a tissue and discard in the trash. Wash hands thoroughly with soap and water. °2. You may remove the patch earlier than 72 hours if you experience unpleasant side effects which may include dry mouth, dizziness or visual disturbances. °3. Avoid touching the patch. Wash your hands with soap and water after contact with the patch. °   °DISCHARGE INSTRUCTIONS: D&C / D&E °The following instructions have been prepared to help you care for yourself upon your return home. °  °Personal hygiene: °• Use sanitary pads for vaginal drainage, not tampons. °• Shower the day after your procedure. °• NO tub baths, pools or Jacuzzis for 2-3 weeks. °• Wipe front to back after using the bathroom. ° °Activity and limitations: °• Do NOT drive or operate any equipment for 24 hours. The effects of anesthesia  are still present and drowsiness may result. °• Do NOT rest in bed all day. °• Walking is encouraged. °• Walk up and down stairs slowly. °• You may resume your normal activity in one to two days or as indicated by your physician. ° °Sexual activity: NO intercourse for at least 2 weeks after the procedure, or as indicated by your physician. ° °Diet: Eat a light meal as desired this evening. You may resume your usual diet tomorrow. ° °Return to work: You may resume your work activities in one to two days or as indicated by your doctor. ° °What to expect after your surgery: Expect to have vaginal bleeding/discharge for 2-3 days and spotting for up to 10 days. It is not unusual to have soreness for up to 1-2 weeks. You may have a slight burning sensation when you urinate for the first day. Mild cramps may continue for a couple of days. You may have a regular period in 2-6 weeks. ° °Call your doctor for any of the following: °• Excessive vaginal bleeding, saturating and changing one pad every hour. °• Inability to urinate 6 hours after discharge from hospital. °• Pain not relieved by pain medication. °• Fever of 100.4° F or greater. °• Unusual vaginal discharge or odor. ° ° Call for an appointment:  ° ° ° ° °

## 2019-12-06 NOTE — Anesthesia Postprocedure Evaluation (Signed)
Anesthesia Post Note  Patient: Mary Alvarado  Procedure(s) Performed: DILATATION & CURETTAGE/HYSTEROSCOPY WITH HYDROTHERMAL ABLATION (N/A )     Patient location during evaluation: PACU Anesthesia Type: General Level of consciousness: awake and sedated Pain management: pain level controlled Vital Signs Assessment: post-procedure vital signs reviewed and stable Respiratory status: spontaneous breathing Cardiovascular status: stable Postop Assessment: no apparent nausea or vomiting Anesthetic complications: no    Last Vitals:  Vitals:   12/06/19 1345 12/06/19 1400  BP: (!) 179/97   Pulse: 65 71  Resp: 19 19  Temp:    SpO2: 98% 98%    Last Pain:  Vitals:   12/06/19 1354  PainSc: 5    Pain Goal: Patients Stated Pain Goal: 5 (12/06/19 1354)                 Caren Macadam

## 2019-12-06 NOTE — Transfer of Care (Signed)
Immediate Anesthesia Transfer of Care Note  Patient: Mary Alvarado  Procedure(s) Performed: DILATATION & CURETTAGE/HYSTEROSCOPY WITH HYDROTHERMAL ABLATION (N/A )  Patient Location: PACU  Anesthesia Type:General  Level of Consciousness: awake, drowsy and responds to stimulation  Airway & Oxygen Therapy: Patient Spontanous Breathing and Patient connected to nasal cannula oxygen  Post-op Assessment: Report given to RN and Post -op Vital signs reviewed and stable  Post vital signs: Reviewed and stable  Last Vitals:  Vitals Value Taken Time  BP    Temp    Pulse    Resp    SpO2      Last Pain:  Vitals:   12/06/19 1134  PainSc: 3       Patients Stated Pain Goal: 5 (12/06/19 1134)  Complications: No apparent anesthesia complications

## 2019-12-07 LAB — SURGICAL PATHOLOGY

## 2019-12-10 ENCOUNTER — Ambulatory Visit: Payer: Self-pay | Admitting: Registered Nurse

## 2019-12-12 ENCOUNTER — Other Ambulatory Visit: Payer: Self-pay

## 2019-12-12 ENCOUNTER — Ambulatory Visit (INDEPENDENT_AMBULATORY_CARE_PROVIDER_SITE_OTHER): Payer: BC Managed Care – PPO | Admitting: Registered Nurse

## 2019-12-12 ENCOUNTER — Encounter: Payer: Self-pay | Admitting: Registered Nurse

## 2019-12-12 VITALS — BP 123/86 | HR 90 | Temp 97.9°F | Ht 64.0 in | Wt 225.0 lb

## 2019-12-12 DIAGNOSIS — I10 Essential (primary) hypertension: Secondary | ICD-10-CM | POA: Diagnosis not present

## 2019-12-12 DIAGNOSIS — Z130 Encounter for screening for diseases of the blood and blood-forming organs and certain disorders involving the immune mechanism: Secondary | ICD-10-CM | POA: Diagnosis not present

## 2019-12-12 DIAGNOSIS — Z13228 Encounter for screening for other metabolic disorders: Secondary | ICD-10-CM

## 2019-12-12 DIAGNOSIS — Z1329 Encounter for screening for other suspected endocrine disorder: Secondary | ICD-10-CM

## 2019-12-12 DIAGNOSIS — Z7689 Persons encountering health services in other specified circumstances: Secondary | ICD-10-CM

## 2019-12-12 DIAGNOSIS — Z13220 Encounter for screening for lipoid disorders: Secondary | ICD-10-CM | POA: Diagnosis not present

## 2019-12-12 NOTE — Progress Notes (Signed)
Established Patient Office Visit  Subjective:  Patient ID: Mary Alvarado, female    DOB: January 07, 1976  Age: 44 y.o. MRN: [id number]  CC:  Chief Complaint  Patient presents with  . New Patient (Initial Visit)    Establish care and discuss blood pressure medication    HPI Mary Alvarado presents for visit to establish care and BP follow up  We spoke via telemed to start her on chlorthalidone for BP control after hypertensive readings in pre-op visits. She recently had a D&C with ablation for dysmenorrhea and AUB.   Her BP has improved much since starting chlorthalidone 25mg  PO qd. Denies CV symptoms of headache, chest pain, visual changes, shob, doe, dependent edema.   Past Medical History:  Diagnosis Date  . Anemia   . Hypertension     Past Surgical History:  Procedure Laterality Date  . DILITATION & CURRETTAGE/HYSTROSCOPY WITH HYDROTHERMAL ABLATION N/A 12/06/2019   Procedure: DILATATION & CURETTAGE/HYSTEROSCOPY WITH HYDROTHERMAL ABLATION;  Surgeon: 12/08/2019, MD;  Location: [facility];  Service: Gynecology;  Laterality: N/A;  . GASTRIC BYPASS  08/2009  . KNEE ARTHROSCOPY Left   . SHOULDER ARTHROSCOPY Left   . TUBAL LIGATION    . WISDOM TOOTH EXTRACTION      Family History  Problem Relation Age of Onset  . Diabetes Mother   . Hypertension Mother     Social History   Socioeconomic History  . Marital status: Single    Spouse name: Not on file  . Number of children: Not on file  . Years of education: Not on file  . Highest education level: Not on file  Occupational History  . Not on file  Tobacco Use  . Smoking status: Former Smoker    Quit date: 08/22/2019    Years since quitting: 0.3  . Smokeless tobacco: Never Used  Substance and Sexual Activity  . Alcohol use: No  . Drug use: No  . Sexual activity: Yes    Birth control/protection: Surgical  Other Topics Concern  . Not on file  Social History Narrative  . Not on file   Social  Determinants of Health   Financial Resource Strain:   . Difficulty of Paying Living Expenses: Not on file  Food Insecurity:   . Worried About 13/08/2019 in the Last Year: Not on file  . Ran Out of Food in the Last Year: Not on file  Transportation Needs:   . Lack of Transportation (Medical): Not on file  . Lack of Transportation (Non-Medical): Not on file  Physical Activity:   . Days of Exercise per Week: Not on file  . Minutes of Exercise per Session: Not on file  Stress:   . Feeling of Stress : Not on file  Social Connections:   . Frequency of Communication with Friends and Family: Not on file  . Frequency of Social Gatherings with Friends and Family: Not on file  . Attends Religious Services: Not on file  . Active Member of Clubs or Organizations: Not on file  . Attends Programme researcher, broadcasting/film/video Meetings: Not on file  . Marital Status: Not on file  Intimate Partner Violence:   . Fear of Current or Ex-Partner: Not on file  . Emotionally Abused: Not on file  . Physically Abused: Not on file  . Sexually Abused: Not on file    Outpatient Medications Prior to Visit  Medication Sig Dispense Refill  . chlorthalidone (HYGROTON) 25 MG tablet Take 1 tablet (  25 mg total) by mouth daily. 30 tablet 0  . ibuprofen (ADVIL,MOTRIN) 800 MG tablet Take 1 tablet (800 mg total) by mouth every 8 (eight) hours as needed for mild pain or moderate pain. 21 tablet 0  . medroxyPROGESTERone (PROVERA) 5 MG tablet Take 1 tablet (5 mg total) by mouth daily. 5 tablet 0  . Prenatal Vit-Fe Fumarate-FA (PRENATAL MULTIVITAMIN) TABS tablet Take 1 tablet by mouth daily at 12 noon.    . traMADol (ULTRAM) 50 MG tablet Take 1-2 tablets (50-100 mg total) by mouth every 6 (six) hours as needed. 30 tablet 0  . IRON PO Take 1 tablet by mouth daily.    . traMADol (ULTRAM) 50 MG tablet Take 1 tablet (50 mg total) by mouth every 6 (six) hours as needed. (Patient not taking: Reported on 11/22/2019) 15 tablet 0   No  facility-administered medications prior to visit.    No Known Allergies  ROS Review of Systems  Constitutional: Negative.   HENT: Negative.   Eyes: Negative.   Respiratory: Negative.   Cardiovascular: Negative.   Gastrointestinal: Negative.   Endocrine: Negative.   Genitourinary: Negative.   Musculoskeletal: Negative.   Skin: Negative.   Allergic/Immunologic: Negative.   Neurological: Negative.   Hematological: Negative.   Psychiatric/Behavioral: Negative.   All other systems reviewed and are negative.     Objective:    Physical Exam  Constitutional: She is oriented to person, place, and time. She appears well-developed and well-nourished. No distress.  Cardiovascular: Normal rate, regular rhythm and normal heart sounds. Exam reveals no gallop and no friction rub.  No murmur heard. Pulmonary/Chest: Effort normal and breath sounds normal. No respiratory distress. She has no wheezes. She has no rales. She exhibits no tenderness.  Neurological: She is alert and oriented to person, place, and time.  Skin: Skin is warm and dry. No rash noted. No erythema. No pallor.  Psychiatric: She has a normal mood and affect. Her behavior is normal. Judgment and thought content normal.  Nursing note and vitals reviewed.   BP 123/86   Pulse 90   Temp 97.9 F (36.6 C) (Temporal)   Ht 5\' 4"  (1.626 m)   Wt 225 lb (102.1 kg)   LMP 09/27/2019 Comment: continuous  SpO2 99%   BMI 38.62 kg/m  Wt Readings from Last 3 Encounters:  12/12/19 225 lb (102.1 kg)  12/06/19 222 lb 6.4 oz (100.9 kg)  11/26/19 234 lb (106.1 kg)     Health Maintenance Due  Topic Date Due  . HIV Screening  02/13/1989  . PAP SMEAR-Modifier  02/14/1995    There are no preventive care reminders to display for this patient.  No results found for: TSH Lab Results  Component Value Date   WBC 4.7 11/26/2019   HGB 11.1 (L) 11/26/2019   HCT 36.7 11/26/2019   MCV 90.6 11/26/2019   PLT 278 11/26/2019   Lab  Results  Component Value Date   NA 140 11/26/2019   K 4.5 11/26/2019   CO2 24 11/26/2019   GLUCOSE 80 11/26/2019   BUN 15 11/26/2019   CREATININE 0.91 11/26/2019   BILITOT 0.6 05/24/2019   ALKPHOS 68 05/24/2019   AST 17 05/24/2019   ALT 12 05/24/2019   PROT 6.5 05/24/2019   ALBUMIN 3.4 (L) 05/24/2019   CALCIUM 8.5 (L) 11/26/2019   ANIONGAP 5 11/26/2019   No results found for: CHOL No results found for: HDL No results found for: LDLCALC No results found for: TRIG No results  found for: CHOLHDL No results found for: SAYT0Z    Assessment & Plan:   Problem List Items Addressed This Visit      Cardiovascular and Mediastinum   Essential hypertension - Primary    Other Visit Diagnoses    Screening for endocrine, metabolic and immunity disorder       Relevant Orders   CBC With Differential (Completed)   Comprehensive metabolic panel (Completed)   TSH (Completed)   Hemoglobin A1c (Completed)   Lipid screening       Relevant Orders   Lipid panel (Completed)   Encounter to establish care          No orders of the defined types were placed in this encounter.   Follow-up: No follow-ups on file.   PLAN  BP wnl. Continue meds  Return for bp check in 2 weeks  Labs drawn, will follow up as warranted  Patient encouraged to call clinic with any questions, comments, or concerns.  Janeece Agee, NP

## 2019-12-13 LAB — COMPREHENSIVE METABOLIC PANEL
ALT: 9 IU/L (ref 0–32)
AST: 17 IU/L (ref 0–40)
Albumin/Globulin Ratio: 1.4 (ref 1.2–2.2)
Albumin: 4.3 g/dL (ref 3.8–4.8)
Alkaline Phosphatase: 98 IU/L (ref 39–117)
BUN/Creatinine Ratio: 19 (ref 9–23)
BUN: 21 mg/dL (ref 6–24)
Bilirubin Total: 0.3 mg/dL (ref 0.0–1.2)
CO2: 20 mmol/L (ref 20–29)
Calcium: 9.6 mg/dL (ref 8.7–10.2)
Chloride: 104 mmol/L (ref 96–106)
Creatinine, Ser: 1.1 mg/dL — ABNORMAL HIGH (ref 0.57–1.00)
GFR calc Af Amer: 70 mL/min/{1.73_m2} (ref >59)
GFR calc non Af Amer: 61 mL/min/{1.73_m2} (ref >59)
Globulin, Total: 3.1 g/dL (ref 1.5–4.5)
Glucose: 96 mg/dL (ref 65–99)
Potassium: 4.6 mmol/L (ref 3.5–5.2)
Sodium: 139 mmol/L (ref 134–144)
Total Protein: 7.4 g/dL (ref 6.0–8.5)

## 2019-12-13 LAB — CBC WITH DIFFERENTIAL
Basophils Absolute: 0.1 10*3/uL (ref 0.0–0.2)
Basos: 1 %
EOS (ABSOLUTE): 0.6 10*3/uL — ABNORMAL HIGH (ref 0.0–0.4)
Eos: 11 %
Hematocrit: 41.5 % (ref 34.0–46.6)
Hemoglobin: 13 g/dL (ref 11.1–15.9)
Immature Grans (Abs): 0 10*3/uL (ref 0.0–0.1)
Immature Granulocytes: 0 %
Lymphocytes Absolute: 1.7 10*3/uL (ref 0.7–3.1)
Lymphs: 34 %
MCH: 27.7 pg (ref 26.6–33.0)
MCHC: 31.3 g/dL — ABNORMAL LOW (ref 31.5–35.7)
MCV: 89 fL (ref 79–97)
Monocytes Absolute: 0.8 10*3/uL (ref 0.1–0.9)
Monocytes: 15 %
Neutrophils Absolute: 1.9 10*3/uL (ref 1.4–7.0)
Neutrophils: 39 %
RBC: 4.69 x10E6/uL (ref 3.77–5.28)
RDW: 13.5 % (ref 11.7–15.4)
WBC: 5 10*3/uL (ref 3.4–10.8)

## 2019-12-13 LAB — HEMOGLOBIN A1C
Est. average glucose Bld gHb Est-mCnc: 103 mg/dL
Hgb A1c MFr Bld: 5.2 % (ref 4.8–5.6)

## 2019-12-13 LAB — LIPID PANEL
Chol/HDL Ratio: 2.5 ratio (ref 0.0–4.4)
Cholesterol, Total: 98 mg/dL — ABNORMAL LOW (ref 100–199)
HDL: 40 mg/dL (ref >39)
LDL Chol Calc (NIH): 45 mg/dL (ref 0–99)
Triglycerides: 56 mg/dL (ref 0–149)
VLDL Cholesterol Cal: 13 mg/dL (ref 5–40)

## 2019-12-13 LAB — TSH: TSH: 0.607 u[IU]/mL (ref 0.450–4.500)

## 2019-12-14 ENCOUNTER — Other Ambulatory Visit: Payer: Self-pay | Admitting: Registered Nurse

## 2019-12-14 DIAGNOSIS — R7989 Other specified abnormal findings of blood chemistry: Secondary | ICD-10-CM

## 2019-12-14 NOTE — Progress Notes (Signed)
Good evening,  If we could give Ms. Freeberg a call to let her know her labs are back, that would be great.   I'd like to repeat her metabolic panel, since her creatinine was a shade on the high side. This may just be because she was dehydrated. If she could come in for a lab only visit in 2-3 weeks, that would be great.  Otherwise, labs are looking awesome.   Thank you  Jari Sportsman, NP

## 2019-12-22 DIAGNOSIS — I10 Essential (primary) hypertension: Secondary | ICD-10-CM | POA: Insufficient documentation

## 2019-12-24 ENCOUNTER — Inpatient Hospital Stay: Payer: BC Managed Care – PPO | Attending: Oncology

## 2019-12-24 ENCOUNTER — Inpatient Hospital Stay: Payer: BC Managed Care – PPO

## 2020-01-07 DIAGNOSIS — N920 Excessive and frequent menstruation with regular cycle: Secondary | ICD-10-CM | POA: Diagnosis not present

## 2020-01-24 ENCOUNTER — Inpatient Hospital Stay: Payer: BC Managed Care – PPO

## 2020-01-25 ENCOUNTER — Inpatient Hospital Stay: Payer: BC Managed Care – PPO

## 2020-01-25 ENCOUNTER — Inpatient Hospital Stay: Payer: BC Managed Care – PPO | Attending: Oncology

## 2020-01-25 ENCOUNTER — Other Ambulatory Visit: Payer: Self-pay

## 2020-01-25 VITALS — BP 155/88 | HR 70 | Temp 98.1°F | Resp 18

## 2020-01-25 DIAGNOSIS — D510 Vitamin B12 deficiency anemia due to intrinsic factor deficiency: Secondary | ICD-10-CM

## 2020-01-25 DIAGNOSIS — I10 Essential (primary) hypertension: Secondary | ICD-10-CM | POA: Diagnosis not present

## 2020-01-25 DIAGNOSIS — Z8249 Family history of ischemic heart disease and other diseases of the circulatory system: Secondary | ICD-10-CM | POA: Diagnosis not present

## 2020-01-25 DIAGNOSIS — R7989 Other specified abnormal findings of blood chemistry: Secondary | ICD-10-CM

## 2020-01-25 DIAGNOSIS — Z833 Family history of diabetes mellitus: Secondary | ICD-10-CM | POA: Insufficient documentation

## 2020-01-25 DIAGNOSIS — Z87891 Personal history of nicotine dependence: Secondary | ICD-10-CM | POA: Diagnosis not present

## 2020-01-25 DIAGNOSIS — D509 Iron deficiency anemia, unspecified: Secondary | ICD-10-CM | POA: Diagnosis not present

## 2020-01-25 DIAGNOSIS — D513 Other dietary vitamin B12 deficiency anemia: Secondary | ICD-10-CM

## 2020-01-25 DIAGNOSIS — Z9884 Bariatric surgery status: Secondary | ICD-10-CM | POA: Diagnosis not present

## 2020-01-25 LAB — BASIC METABOLIC PANEL
Anion gap: 10 (ref 5–15)
BUN: 15 mg/dL (ref 6–20)
CO2: 22 mmol/L (ref 22–32)
Calcium: 8.1 mg/dL — ABNORMAL LOW (ref 8.9–10.3)
Chloride: 110 mmol/L (ref 98–111)
Creatinine, Ser: 0.84 mg/dL (ref 0.44–1.00)
GFR calc Af Amer: >60 mL/min (ref >60)
GFR calc non Af Amer: >60 mL/min (ref >60)
Glucose, Bld: 80 mg/dL (ref 70–99)
Potassium: 4.1 mmol/L (ref 3.5–5.1)
Sodium: 142 mmol/L (ref 135–145)

## 2020-01-25 LAB — CBC WITH DIFFERENTIAL (CANCER CENTER ONLY)
Abs Immature Granulocytes: 0.01 10*3/uL (ref 0.00–0.07)
Basophils Absolute: 0 10*3/uL (ref 0.0–0.1)
Basophils Relative: 1 %
Eosinophils Absolute: 0.4 10*3/uL (ref 0.0–0.5)
Eosinophils Relative: 7 %
HCT: 36.2 % (ref 36.0–46.0)
Hemoglobin: 11.2 g/dL — ABNORMAL LOW (ref 12.0–15.0)
Immature Granulocytes: 0 %
Lymphocytes Relative: 33 %
Lymphs Abs: 1.6 10*3/uL (ref 0.7–4.0)
MCH: 27.7 pg (ref 26.0–34.0)
MCHC: 30.9 g/dL (ref 30.0–36.0)
MCV: 89.4 fL (ref 80.0–100.0)
Monocytes Absolute: 0.8 10*3/uL (ref 0.1–1.0)
Monocytes Relative: 16 %
Neutro Abs: 2.1 10*3/uL (ref 1.7–7.7)
Neutrophils Relative %: 43 %
Platelet Count: 317 10*3/uL (ref 150–400)
RBC: 4.05 MIL/uL (ref 3.87–5.11)
RDW: 16.5 % — ABNORMAL HIGH (ref 11.5–15.5)
WBC Count: 5 10*3/uL (ref 4.0–10.5)
nRBC: 0 % (ref 0.0–0.2)

## 2020-01-25 MED ORDER — CYANOCOBALAMIN 1000 MCG/ML IJ SOLN
1000.0000 ug | Freq: Once | INTRAMUSCULAR | Status: AC
  Administered 2020-01-25: 1000 ug via INTRAMUSCULAR

## 2020-01-28 ENCOUNTER — Other Ambulatory Visit: Payer: Self-pay | Admitting: Adult Health

## 2020-01-28 LAB — IRON AND TIBC
Iron: 16 ug/dL — ABNORMAL LOW (ref 41–142)
Saturation Ratios: 4 % — ABNORMAL LOW (ref 21–57)
TIBC: 384 ug/dL (ref 236–444)
UIBC: 368 ug/dL (ref 120–384)

## 2020-01-28 LAB — FERRITIN: Ferritin: 8 ng/mL — ABNORMAL LOW (ref 11–307)

## 2020-01-31 ENCOUNTER — Other Ambulatory Visit: Payer: Self-pay

## 2020-01-31 ENCOUNTER — Inpatient Hospital Stay: Payer: BC Managed Care – PPO | Admitting: Adult Health

## 2020-01-31 ENCOUNTER — Encounter: Payer: Self-pay | Admitting: Adult Health

## 2020-01-31 ENCOUNTER — Inpatient Hospital Stay: Payer: BC Managed Care – PPO

## 2020-01-31 VITALS — BP 155/78 | HR 78 | Temp 99.0°F | Resp 18

## 2020-01-31 DIAGNOSIS — D510 Vitamin B12 deficiency anemia due to intrinsic factor deficiency: Secondary | ICD-10-CM

## 2020-01-31 DIAGNOSIS — D500 Iron deficiency anemia secondary to blood loss (chronic): Secondary | ICD-10-CM

## 2020-01-31 DIAGNOSIS — Z9884 Bariatric surgery status: Secondary | ICD-10-CM | POA: Diagnosis not present

## 2020-01-31 DIAGNOSIS — Z8249 Family history of ischemic heart disease and other diseases of the circulatory system: Secondary | ICD-10-CM | POA: Diagnosis not present

## 2020-01-31 DIAGNOSIS — Z833 Family history of diabetes mellitus: Secondary | ICD-10-CM | POA: Diagnosis not present

## 2020-01-31 DIAGNOSIS — I10 Essential (primary) hypertension: Secondary | ICD-10-CM | POA: Diagnosis not present

## 2020-01-31 DIAGNOSIS — D509 Iron deficiency anemia, unspecified: Secondary | ICD-10-CM | POA: Diagnosis not present

## 2020-01-31 DIAGNOSIS — Z87891 Personal history of nicotine dependence: Secondary | ICD-10-CM | POA: Diagnosis not present

## 2020-01-31 DIAGNOSIS — D513 Other dietary vitamin B12 deficiency anemia: Secondary | ICD-10-CM

## 2020-01-31 MED ORDER — HEPARIN SOD (PORK) LOCK FLUSH 100 UNIT/ML IV SOLN
500.0000 [IU] | Freq: Once | INTRAVENOUS | Status: DC | PRN
  Filled 2020-01-31: qty 5

## 2020-01-31 MED ORDER — SODIUM CHLORIDE 0.9% FLUSH
10.0000 mL | Freq: Once | INTRAVENOUS | Status: DC | PRN
  Filled 2020-01-31: qty 10

## 2020-01-31 MED ORDER — ACETAMINOPHEN 325 MG PO TABS
ORAL_TABLET | ORAL | Status: AC
  Filled 2020-01-31: qty 2

## 2020-01-31 MED ORDER — HEPARIN SOD (PORK) LOCK FLUSH 100 UNIT/ML IV SOLN
250.0000 [IU] | Freq: Once | INTRAVENOUS | Status: DC | PRN
  Filled 2020-01-31: qty 5

## 2020-01-31 MED ORDER — DIPHENHYDRAMINE HCL 25 MG PO TABS
25.0000 mg | ORAL_TABLET | Freq: Once | ORAL | Status: AC
  Administered 2020-01-31: 25 mg via ORAL
  Filled 2020-01-31: qty 1

## 2020-01-31 MED ORDER — ALTEPLASE 2 MG IJ SOLR
2.0000 mg | Freq: Once | INTRAMUSCULAR | Status: DC | PRN
  Filled 2020-01-31: qty 2

## 2020-01-31 MED ORDER — SODIUM CHLORIDE 0.9 % IV SOLN
Freq: Once | INTRAVENOUS | Status: AC
  Filled 2020-01-31: qty 250

## 2020-01-31 MED ORDER — LORATADINE 10 MG PO TABS
ORAL_TABLET | ORAL | Status: AC
  Filled 2020-01-31: qty 1

## 2020-01-31 MED ORDER — METHYLPREDNISOLONE SODIUM SUCC 125 MG IJ SOLR
INTRAMUSCULAR | Status: AC
  Filled 2020-01-31: qty 2

## 2020-01-31 MED ORDER — SODIUM CHLORIDE 0.9 % IV SOLN
510.0000 mg | Freq: Once | INTRAVENOUS | Status: AC
  Administered 2020-01-31: 510 mg via INTRAVENOUS
  Filled 2020-01-31: qty 510

## 2020-01-31 MED ORDER — ACETAMINOPHEN 325 MG PO TABS
650.0000 mg | ORAL_TABLET | Freq: Once | ORAL | Status: AC
  Administered 2020-01-31: 10:00:00 650 mg via ORAL

## 2020-01-31 MED ORDER — FAMOTIDINE IN NACL 20-0.9 MG/50ML-% IV SOLN
INTRAVENOUS | Status: AC
  Filled 2020-01-31: qty 50

## 2020-01-31 MED ORDER — SODIUM CHLORIDE 0.9% FLUSH
3.0000 mL | Freq: Once | INTRAVENOUS | Status: DC | PRN
  Filled 2020-01-31: qty 10

## 2020-01-31 MED ORDER — DIPHENHYDRAMINE HCL 25 MG PO CAPS
ORAL_CAPSULE | ORAL | Status: AC
  Filled 2020-01-31: qty 2

## 2020-01-31 NOTE — Assessment & Plan Note (Signed)
Mary Alvarado has h/o b12 deficiency and continues on monthly injections of her b12.  She continues on this with good tolerance.    Mary Alvarado's recent iron studies were low.  She will receive IV feraheme today and again in one week.  She cannot absorb oral iron due to her history of bypass.  Her menorrhagia has been resolved with her ablation that was completed in early March.  I anticipate that after she receives IV iron this will fix her issues as she is no longer bleeding, and she will feel better and no longer need supplementation.  Should her iron studies remain low, then she will likely need GI work up.   Mary Alvarado is in agreement with this, and we will discuss further in August when she has f/u with Dr. Darnelle Catalan.

## 2020-01-31 NOTE — Progress Notes (Signed)
[facility] Follow up:    Maximiano Coss, NP [place] [address]   DIAGNOSIS: B12 DEFICIENCY/ iron deficiency  SUMMARY OF HEMATOLOGIC HISTORY: ASSESSMENT: 44 y.o. [place] woman with a history of gastric bypass presenting May 2019 with severe B12 deficiency  (1) intrinsic factor normal but antiparietal cell antibodies +04/10/2018  (2) monthly B12 supplementation started 04/10/2018  CURRENT THERAPY: B12 injections monthly  INTERVAL HISTORY: LENZIE MONTESANO 44 y.o. female is here for evaluation of her iron deficiency.  She was having menorrhagia and underwent ablation in March of 2021.  She had had fatigue prior to then, and it has continued, along with ice chewing.  She denies any new issues, and continues to work two jobs, from 430-830 and then from 9-6.     Patient Active Problem List   Diagnosis Date Noted  . Iron deficiency anemia due to chronic blood loss 01/31/2020  . Essential hypertension 12/22/2019  . Pernicious anemia 02/24/2018  . B12 deficiency anemia 02/24/2018  . Vitamin B12 deficiency anemia due to intrinsic factor deficiency 02/24/2018    has No Known Allergies.  MEDICAL HISTORY: Past Medical History:  Diagnosis Date  . Anemia   . Hypertension     SURGICAL HISTORY: Past Surgical History:  Procedure Laterality Date  . DILITATION & CURRETTAGE/HYSTROSCOPY WITH HYDROTHERMAL ABLATION N/A 12/06/2019   Procedure: DILATATION & CURETTAGE/HYSTEROSCOPY WITH HYDROTHERMAL ABLATION;  Surgeon: Brien Few, MD;  Location: [facility];  Service: Gynecology;  Laterality: N/A;  . GASTRIC BYPASS  08/2009  . KNEE ARTHROSCOPY Left   . SHOULDER ARTHROSCOPY Left   . TUBAL LIGATION    . WISDOM TOOTH EXTRACTION      SOCIAL HISTORY: Social History   Socioeconomic History  . Marital status: Single    Spouse name: Not on file  . Number of children: Not on file  . Years of education: Not on file  . Highest  education level: Not on file  Occupational History  . Not on file  Tobacco Use  . Smoking status: Former Smoker    Quit date: 08/22/2019    Years since quitting: 0.4  . Smokeless tobacco: Never Used  Substance and Sexual Activity  . Alcohol use: No  . Drug use: No  . Sexual activity: Yes    Birth control/protection: Surgical  Other Topics Concern  . Not on file  Social History Narrative  . Not on file   Social Determinants of Health   Financial Resource Strain:   . Difficulty of Paying Living Expenses:   Food Insecurity:   . Worried About Charity fundraiser in the Last Year:   . Arboriculturist in the Last Year:   Transportation Needs:   . Film/video editor (Medical):   Marland Kitchen Lack of Transportation (Non-Medical):   Physical Activity:   . Days of Exercise per Week:   . Minutes of Exercise per Session:   Stress:   . Feeling of Stress :   Social Connections:   . Frequency of Communication with Friends and Family:   . Frequency of Social Gatherings with Friends and Family:   . Attends Religious Services:   . Active Member of Clubs or Organizations:   . Attends Archivist Meetings:   Marland Kitchen Marital Status:   Intimate Partner Violence:   . Fear of Current or Ex-Partner:   . Emotionally Abused:   Marland Kitchen Physically Abused:   . Sexually Abused:     FAMILY  HISTORY: Family History  Problem Relation Age of Onset  . Diabetes Mother   . Hypertension Mother     Review of Systems  Constitutional: Positive for fatigue. Negative for appetite change, chills, fever and unexpected weight change.  HENT:   Negative for hearing loss, lump/mass and trouble swallowing.   Eyes: Negative for eye problems and icterus.  Respiratory: Negative for chest tightness, cough and shortness of breath.   Cardiovascular: Negative for chest pain, leg swelling and palpitations.  Gastrointestinal: Negative for abdominal distention, abdominal pain, constipation, diarrhea, nausea and vomiting.   Endocrine: Negative for hot flashes.  Genitourinary: Negative for difficulty urinating.   Musculoskeletal: Negative for arthralgias.  Skin: Negative for itching and rash.  Neurological: Negative for dizziness, extremity weakness, headaches and numbness.  Hematological: Negative for adenopathy. Does not bruise/bleed easily.  Psychiatric/Behavioral: Negative for depression. The patient is not nervous/anxious.       PHYSICAL EXAMINATION  ECOG PERFORMANCE STATUS: 1 - Symptomatic but completely ambulatory  There were no vitals filed for this visit.  Seen in infusion  Physical Exam Constitutional:      General: She is not in acute distress.    Appearance: Normal appearance. She is not toxic-appearing.  HENT:     Head: Normocephalic and atraumatic.  Eyes:     General: No scleral icterus.    Pupils: Pupils are equal, round, and reactive to light.  Cardiovascular:     Rate and Rhythm: Normal rate and regular rhythm.     Pulses: Normal pulses.     Heart sounds: Normal heart sounds.  Pulmonary:     Effort: Pulmonary effort is normal.  Abdominal:     General: Abdomen is flat. Bowel sounds are normal. There is no distension.     Palpations: Abdomen is soft.     Tenderness: There is no abdominal tenderness.  Musculoskeletal:        General: No swelling.     Cervical back: Neck supple.  Skin:    General: Skin is warm and dry.     Findings: No rash.  Neurological:     General: No focal deficit present.     Mental Status: She is alert and oriented to person, place, and time.  Psychiatric:        Mood and Affect: Mood normal.        Behavior: Behavior normal.     LABORATORY DATA:  CBC    Component Value Date/Time   WBC 5.0 01/25/2020 1500   WBC 4.7 11/26/2019 0839   RBC 4.05 01/25/2020 1500   HGB 11.2 (L) 01/25/2020 1500   HGB 13.0 12/12/2019 1124   HCT 36.2 01/25/2020 1500   HCT 41.5 12/12/2019 1124   PLT 317 01/25/2020 1500   MCV 89.4 01/25/2020 1500   MCV 89  12/12/2019 1124   MCH 27.7 01/25/2020 1500   MCHC 30.9 01/25/2020 1500   RDW 16.5 (H) 01/25/2020 1500   RDW 13.5 12/12/2019 1124   LYMPHSABS 1.6 01/25/2020 1500   LYMPHSABS 1.7 12/12/2019 1124   MONOABS 0.8 01/25/2020 1500   EOSABS 0.4 01/25/2020 1500   EOSABS 0.6 (H) 12/12/2019 1124   BASOSABS 0.0 01/25/2020 1500   BASOSABS 0.1 12/12/2019 1124    CMP     Component Value Date/Time   NA 142 01/25/2020 1500   NA 139 12/12/2019 1124   K 4.1 01/25/2020 1500   CL 110 01/25/2020 1500   CO2 22 01/25/2020 1500   GLUCOSE 80 01/25/2020 1500   BUN  15 01/25/2020 1500   BUN 21 12/12/2019 1124   CREATININE 0.84 01/25/2020 1500   CREATININE 0.90 05/24/2019 0929   CALCIUM 8.1 (L) 01/25/2020 1500   PROT 7.4 12/12/2019 1124   ALBUMIN 4.3 12/12/2019 1124   AST 17 12/12/2019 1124   AST 17 05/24/2019 0929   ALT 9 12/12/2019 1124   ALT 12 05/24/2019 0929   ALKPHOS 98 12/12/2019 1124   BILITOT 0.3 12/12/2019 1124   BILITOT 0.6 05/24/2019 0929   GFRNONAA >60 01/25/2020 1500   GFRNONAA >60 05/24/2019 0929   GFRAA >60 01/25/2020 1500   GFRAA >60 05/24/2019 0929        ASSESSMENT and THERAPY PLAN:   Vitamin B12 deficiency anemia due to intrinsic factor deficiency Katoya has h/o b12 deficiency and continues on monthly injections of her b12.  She continues on this with good tolerance.    Jacquiline's recent iron studies were low.  She will receive IV feraheme today and again in one week.  She cannot absorb oral iron due to her history of bypass.  Her menorrhagia has been resolved with her ablation that was completed in early March.  I anticipate that after she receives IV iron this will fix her issues as she is no longer bleeding, and she will feel better and no longer need supplementation.  Should her iron studies remain low, then she will likely need GI work up.   Analeise is in agreement with this, and we will discuss further in August when she has f/u with Dr. Darnelle Catalan.    All questions  were answered. The patient knows to call the clinic with any problems, questions or concerns. We can certainly see the patient much sooner if necessary.  Total time spent: 20 minutes*  Lillard Anes, NP 01/31/20 12:36 PM Medical Oncology and Hematology [facility] [address] [place], [place] [address] Tel. [phone]    Fax. [phone]  *Total Encounter Time as defined by the Centers for Medicare and Medicaid Services includes, in addition to the face-to-face time of a patient visit (documented in the note above) non-face-to-face time: obtaining and reviewing outside history, ordering and reviewing medications, tests or procedures, care coordination (communications with other health care professionals or caregivers) and documentation in the medical record.

## 2020-01-31 NOTE — Patient Instructions (Signed)
Ferumoxytol injection What is this medicine? FERUMOXYTOL is an iron complex. Iron is used to make healthy red blood cells, which carry oxygen and nutrients throughout the body. This medicine is used to treat iron deficiency anemia. This medicine may be used for other purposes; ask your health care provider or pharmacist if you have questions. COMMON BRAND NAME(S): Feraheme What should I tell my health care provider before I take this medicine? They need to know if you have any of these conditions:  anemia not caused by low iron levels  high levels of iron in the blood  magnetic resonance imaging (MRI) test scheduled  an unusual or allergic reaction to iron, other medicines, foods, dyes, or preservatives  pregnant or trying to get pregnant  breast-feeding How should I use this medicine? This medicine is for injection into a vein. It is given by a health care professional in a hospital or clinic setting. Talk to your pediatrician regarding the use of this medicine in children. Special care may be needed. Overdosage: If you think you have taken too much of this medicine contact a poison control center or emergency room at once. NOTE: This medicine is only for you. Do not share this medicine with others. What if I miss a dose? It is important not to miss your dose. Call your doctor or health care professional if you are unable to keep an appointment. What may interact with this medicine? This medicine may interact with the following medications:  other iron products This list may not describe all possible interactions. Give your health care provider a list of all the medicines, herbs, non-prescription drugs, or dietary supplements you use. Also tell them if you smoke, drink alcohol, or use illegal drugs. Some items may interact with your medicine. What should I watch for while using this medicine? Visit your doctor or healthcare professional regularly. Tell your doctor or healthcare  professional if your symptoms do not start to get better or if they get worse. You may need blood work done while you are taking this medicine. You may need to follow a special diet. Talk to your doctor. Foods that contain iron include: whole grains/cereals, dried fruits, beans, or peas, leafy green vegetables, and organ meats (liver, kidney). What side effects may I notice from receiving this medicine? Side effects that you should report to your doctor or health care professional as soon as possible:  allergic reactions like skin rash, itching or hives, swelling of the face, lips, or tongue  breathing problems  changes in blood pressure  feeling faint or lightheaded, falls  fever or chills  flushing, sweating, or hot feelings  swelling of the ankles or feet Side effects that usually do not require medical attention (report to your doctor or health care professional if they continue or are bothersome):  diarrhea  headache  nausea, vomiting  stomach pain This list may not describe all possible side effects. Call your doctor for medical advice about side effects. You may report side effects to FDA at 1-800-FDA-1088. Where should I keep my medicine? This drug is given in a hospital or clinic and will not be stored at home. NOTE: This sheet is a summary. It may not cover all possible information. If you have questions about this medicine, talk to your doctor, pharmacist, or health care provider.  2020 Elsevier/Gold Standard (2016-11-15 20:21:10) Coronavirus (COVID-19) Are you at risk?  Are you at risk for the Coronavirus (COVID-19)?  To be considered HIGH RISK for Coronavirus (COVID-19),   you have to meet the following criteria:  . Traveled to [place], [place], [place], [place] or [place]; or in the [place] to [place], [place], [place], or [place]; and have fever, cough, and shortness of breath within the last 2 weeks of travel OR . Been in close contact with a person  diagnosed with COVID-19 within the last 2 weeks and have fever, cough, and shortness of breath . IF YOU DO NOT MEET THESE CRITERIA, YOU ARE CONSIDERED LOW RISK FOR COVID-19.  What to do if you are HIGH RISK for COVID-19?  . If you are having a medical emergency, call 911. . Seek medical care right away. Before you go to a doctor's office, urgent care or emergency department, call ahead and tell them about your recent travel, contact with someone diagnosed with COVID-19, and your symptoms. You should receive instructions from your physician's office regarding next steps of care.  . When you arrive at healthcare provider, tell the healthcare staff immediately you have returned from visiting [place], [place], [place], [place] or [place]; or traveled in the [place] to [place], [place], [place], or [place]; in the last two weeks or you have been in close contact with a person diagnosed with COVID-19 in the last 2 weeks.   . Tell the health care staff about your symptoms: fever, cough and shortness of breath. . After you have been seen by a medical provider, you will be either: o Tested for (COVID-19) and discharged home on quarantine except to seek medical care if symptoms worsen, and asked to  - Stay home and avoid contact with others until you get your results (4-5 days)  - Avoid travel on public transportation if possible (such as bus, train, or airplane) or o Sent to the Emergency Department by EMS for evaluation, COVID-19 testing, and possible admission depending on your condition and test results.  What to do if you are LOW RISK for COVID-19?  Reduce your risk of any infection by using the same precautions used for avoiding the common cold or flu:  . Wash your hands often with soap and warm water for at least 20 seconds.  If soap and water are not readily available, use an alcohol-based hand sanitizer with at least 60% alcohol.  . If coughing or sneezing, cover your mouth and nose by  coughing or sneezing into the elbow areas of your shirt or coat, into a tissue or into your sleeve (not your hands). . Avoid shaking hands with others and consider head nods or verbal greetings only. . Avoid touching your eyes, nose, or mouth with unwashed hands.  . Avoid close contact with people who are sick. . Avoid places or events with large numbers of people in one location, like concerts or sporting events. . Carefully consider travel plans you have or are making. . If you are planning any travel outside or inside the [place], visit the CDC's Travelers' Health webpage for the latest health notices. . If you have some symptoms but not all symptoms, continue to monitor at home and seek medical attention if your symptoms worsen. . If you are having a medical emergency, call 911.   ADDITIONAL HEALTHCARE OPTIONS FOR PATIENTS  [facility] / e-Visit: [email or web address]         [facility]: [phone]  [facility]: [phone]                   [facility]: [phone]   

## 2020-02-01 ENCOUNTER — Telehealth: Payer: Self-pay | Admitting: Adult Health

## 2020-02-01 NOTE — Telephone Encounter (Signed)
No 4/22 los. No changes made to pt's schedule.  

## 2020-02-07 ENCOUNTER — Other Ambulatory Visit: Payer: Self-pay

## 2020-02-07 ENCOUNTER — Inpatient Hospital Stay: Payer: BC Managed Care – PPO

## 2020-02-07 VITALS — BP 133/69 | HR 69 | Temp 98.4°F | Resp 18

## 2020-02-07 DIAGNOSIS — D510 Vitamin B12 deficiency anemia due to intrinsic factor deficiency: Secondary | ICD-10-CM

## 2020-02-07 DIAGNOSIS — D509 Iron deficiency anemia, unspecified: Secondary | ICD-10-CM | POA: Diagnosis not present

## 2020-02-07 DIAGNOSIS — Z833 Family history of diabetes mellitus: Secondary | ICD-10-CM | POA: Diagnosis not present

## 2020-02-07 DIAGNOSIS — Z8249 Family history of ischemic heart disease and other diseases of the circulatory system: Secondary | ICD-10-CM | POA: Diagnosis not present

## 2020-02-07 DIAGNOSIS — Z87891 Personal history of nicotine dependence: Secondary | ICD-10-CM | POA: Diagnosis not present

## 2020-02-07 DIAGNOSIS — I10 Essential (primary) hypertension: Secondary | ICD-10-CM | POA: Diagnosis not present

## 2020-02-07 DIAGNOSIS — Z9884 Bariatric surgery status: Secondary | ICD-10-CM | POA: Diagnosis not present

## 2020-02-07 DIAGNOSIS — D513 Other dietary vitamin B12 deficiency anemia: Secondary | ICD-10-CM

## 2020-02-07 MED ORDER — SODIUM CHLORIDE 0.9 % IV SOLN
510.0000 mg | Freq: Once | INTRAVENOUS | Status: AC
  Administered 2020-02-07: 510 mg via INTRAVENOUS
  Filled 2020-02-07: qty 510

## 2020-02-07 MED ORDER — SODIUM CHLORIDE 0.9 % IV SOLN
Freq: Once | INTRAVENOUS | Status: AC
  Filled 2020-02-07: qty 250

## 2020-02-07 NOTE — Patient Instructions (Signed)
Ferumoxytol (Feraheme) injection What is this medicine? FERUMOXYTOL is an iron complex. Iron is used to make healthy red blood cells, which carry oxygen and nutrients throughout the body. This medicine is used to treat iron deficiency anemia. This medicine may be used for other purposes; ask your health care provider or pharmacist if you have questions. COMMON BRAND NAME(S): Feraheme What should I tell my health care provider before I take this medicine? They need to know if you have any of these conditions:  anemia not caused by low iron levels  high levels of iron in the blood  magnetic resonance imaging (MRI) test scheduled  an unusual or allergic reaction to iron, other medicines, foods, dyes, or preservatives  pregnant or trying to get pregnant  breast-feeding How should I use this medicine? This medicine is for injection into a vein. It is given by a health care professional in a hospital or clinic setting. Talk to your pediatrician regarding the use of this medicine in children. Special care may be needed. Overdosage: If you think you have taken too much of this medicine contact a poison control center or emergency room at once. NOTE: This medicine is only for you. Do not share this medicine with others. What if I miss a dose? It is important not to miss your dose. Call your doctor or health care professional if you are unable to keep an appointment. What may interact with this medicine? This medicine may interact with the following medications:  other iron products This list may not describe all possible interactions. Give your health care provider a list of all the medicines, herbs, non-prescription drugs, or dietary supplements you use. Also tell them if you smoke, drink alcohol, or use illegal drugs. Some items may interact with your medicine. What should I watch for while using this medicine? Visit your doctor or healthcare professional regularly. Tell your doctor or  healthcare professional if your symptoms do not start to get better or if they get worse. You may need blood work done while you are taking this medicine. You may need to follow a special diet. Talk to your doctor. Foods that contain iron include: whole grains/cereals, dried fruits, beans, or peas, leafy green vegetables, and organ meats (liver, kidney). What side effects may I notice from receiving this medicine? Side effects that you should report to your doctor or health care professional as soon as possible:  allergic reactions like skin rash, itching or hives, swelling of the face, lips, or tongue  breathing problems  changes in blood pressure  feeling faint or lightheaded, falls  fever or chills  flushing, sweating, or hot feelings  swelling of the ankles or feet Side effects that usually do not require medical attention (report to your doctor or health care professional if they continue or are bothersome):  diarrhea  headache  nausea, vomiting  stomach pain This list may not describe all possible side effects. Call your doctor for medical advice about side effects. You may report side effects to FDA at 1-800-FDA-1088. Where should I keep my medicine? This drug is given in a hospital or clinic and will not be stored at home. NOTE: This sheet is a summary. It may not cover all possible information. If you have questions about this medicine, talk to your doctor, pharmacist, or health care provider.  2020 Elsevier/Gold Standard (2016-11-15 20:21:10)  

## 2020-02-22 ENCOUNTER — Inpatient Hospital Stay: Payer: BC Managed Care – PPO

## 2020-02-26 ENCOUNTER — Inpatient Hospital Stay: Payer: BC Managed Care – PPO

## 2020-03-06 DIAGNOSIS — F4322 Adjustment disorder with anxiety: Secondary | ICD-10-CM | POA: Diagnosis not present

## 2020-03-21 ENCOUNTER — Other Ambulatory Visit: Payer: Self-pay

## 2020-03-21 ENCOUNTER — Other Ambulatory Visit: Payer: Self-pay | Admitting: Oncology

## 2020-03-21 DIAGNOSIS — D510 Vitamin B12 deficiency anemia due to intrinsic factor deficiency: Secondary | ICD-10-CM

## 2020-03-24 ENCOUNTER — Inpatient Hospital Stay: Payer: BC Managed Care – PPO

## 2020-03-24 ENCOUNTER — Inpatient Hospital Stay: Payer: BC Managed Care – PPO | Attending: Oncology

## 2020-03-25 DIAGNOSIS — F4322 Adjustment disorder with anxiety: Secondary | ICD-10-CM | POA: Diagnosis not present

## 2020-04-03 DIAGNOSIS — F4322 Adjustment disorder with anxiety: Secondary | ICD-10-CM | POA: Diagnosis not present

## 2020-04-22 DIAGNOSIS — F4321 Adjustment disorder with depressed mood: Secondary | ICD-10-CM | POA: Diagnosis not present

## 2020-04-22 DIAGNOSIS — F4322 Adjustment disorder with anxiety: Secondary | ICD-10-CM | POA: Diagnosis not present

## 2020-04-23 ENCOUNTER — Inpatient Hospital Stay: Payer: BC Managed Care – PPO | Attending: Oncology

## 2020-04-23 ENCOUNTER — Inpatient Hospital Stay: Payer: BC Managed Care – PPO

## 2020-04-23 DIAGNOSIS — D510 Vitamin B12 deficiency anemia due to intrinsic factor deficiency: Secondary | ICD-10-CM | POA: Insufficient documentation

## 2020-04-29 ENCOUNTER — Other Ambulatory Visit: Payer: Self-pay | Admitting: Oncology

## 2020-05-01 ENCOUNTER — Inpatient Hospital Stay: Payer: BC Managed Care – PPO

## 2020-05-01 ENCOUNTER — Other Ambulatory Visit: Payer: Self-pay

## 2020-05-01 VITALS — BP 146/71 | HR 62

## 2020-05-01 DIAGNOSIS — D500 Iron deficiency anemia secondary to blood loss (chronic): Secondary | ICD-10-CM

## 2020-05-01 DIAGNOSIS — D513 Other dietary vitamin B12 deficiency anemia: Secondary | ICD-10-CM

## 2020-05-01 DIAGNOSIS — D510 Vitamin B12 deficiency anemia due to intrinsic factor deficiency: Secondary | ICD-10-CM | POA: Diagnosis not present

## 2020-05-01 LAB — CBC WITH DIFFERENTIAL (CANCER CENTER ONLY)
Abs Immature Granulocytes: 0.01 10*3/uL (ref 0.00–0.07)
Basophils Absolute: 0 10*3/uL (ref 0.0–0.1)
Basophils Relative: 1 %
Eosinophils Absolute: 0.4 10*3/uL (ref 0.0–0.5)
Eosinophils Relative: 8 %
HCT: 41.6 % (ref 36.0–46.0)
Hemoglobin: 13.5 g/dL (ref 12.0–15.0)
Immature Granulocytes: 0 %
Lymphocytes Relative: 28 %
Lymphs Abs: 1.4 10*3/uL (ref 0.7–4.0)
MCH: 31 pg (ref 26.0–34.0)
MCHC: 32.5 g/dL (ref 30.0–36.0)
MCV: 95.4 fL (ref 80.0–100.0)
Monocytes Absolute: 0.8 10*3/uL (ref 0.1–1.0)
Monocytes Relative: 17 %
Neutro Abs: 2.3 10*3/uL (ref 1.7–7.7)
Neutrophils Relative %: 46 %
Platelet Count: 260 10*3/uL (ref 150–400)
RBC: 4.36 MIL/uL (ref 3.87–5.11)
RDW: 16.5 % — ABNORMAL HIGH (ref 11.5–15.5)
WBC Count: 4.9 10*3/uL (ref 4.0–10.5)
nRBC: 0 % (ref 0.0–0.2)

## 2020-05-01 LAB — CMP (CANCER CENTER ONLY)
ALT: 9 U/L (ref 0–44)
AST: 17 U/L (ref 15–41)
Albumin: 3.6 g/dL (ref 3.5–5.0)
Alkaline Phosphatase: 70 U/L (ref 38–126)
Anion gap: 8 (ref 5–15)
BUN: 11 mg/dL (ref 6–20)
CO2: 23 mmol/L (ref 22–32)
Calcium: 8.9 mg/dL (ref 8.9–10.3)
Chloride: 109 mmol/L (ref 98–111)
Creatinine: 0.81 mg/dL (ref 0.44–1.00)
GFR, Est AFR Am: >60 mL/min (ref >60)
GFR, Estimated: >60 mL/min (ref >60)
Glucose, Bld: 79 mg/dL (ref 70–99)
Potassium: 4 mmol/L (ref 3.5–5.1)
Sodium: 140 mmol/L (ref 135–145)
Total Bilirubin: 0.7 mg/dL (ref 0.3–1.2)
Total Protein: 6.6 g/dL (ref 6.5–8.1)

## 2020-05-01 LAB — IRON AND TIBC
Iron: 95 ug/dL (ref 41–142)
Saturation Ratios: 38 % (ref 21–57)
TIBC: 254 ug/dL (ref 236–444)
UIBC: 159 ug/dL (ref 120–384)

## 2020-05-01 LAB — FERRITIN: Ferritin: 52 ng/mL (ref 11–307)

## 2020-05-01 LAB — VITAMIN B12: Vitamin B-12: 136 pg/mL — ABNORMAL LOW (ref 180–914)

## 2020-05-01 MED ORDER — CYANOCOBALAMIN 1000 MCG/ML IJ SOLN
1000.0000 ug | Freq: Once | INTRAMUSCULAR | Status: AC
  Administered 2020-05-01: 1000 ug via INTRAMUSCULAR

## 2020-05-01 NOTE — Patient Instructions (Signed)

## 2020-05-25 NOTE — Progress Notes (Deleted)
Cancer Center Cancer Follow up:    Mary Agee, NP [address] [place] [address]   DIAGNOSIS: B12 DEFICIENCY/ iron deficiency  SUMMARY OF HEMATOLOGIC HISTORY: ASSESSMENT: 44 y.o. 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 female is here for evaluation of her iron deficiency.  She was having menorrhagia and underwent ablation in March of 2021.  She had had fatigue prior to then, and it has continued, along with ice chewing.  She denies any new issues, and continues to work two jobs, from 430-830 and then from 9-6.     Patient Active Problem List   Diagnosis Date Noted  . Iron deficiency anemia due to chronic blood loss 01/31/2020  . Essential hypertension 12/22/2019  . Pernicious anemia 02/24/2018  . B12 deficiency anemia 02/24/2018  . Vitamin B12 deficiency anemia due to intrinsic factor deficiency 02/24/2018    has No Known Allergies.  MEDICAL HISTORY: Past Medical History:  Diagnosis Date  . Anemia   . Hypertension     SURGICAL HISTORY: Past Surgical History:  Procedure Laterality Date  . DILITATION & CURRETTAGE/HYSTROSCOPY WITH HYDROTHERMAL ABLATION N/A 12/06/2019   Procedure: DILATATION & CURETTAGE/HYSTEROSCOPY WITH HYDROTHERMAL ABLATION;  Surgeon: Olivia Mackie, MD;  Location: [facility];  Service: Gynecology;  Laterality: N/A;  . GASTRIC BYPASS  08/2009  . KNEE ARTHROSCOPY Left   . SHOULDER ARTHROSCOPY Left   . TUBAL LIGATION    . WISDOM TOOTH EXTRACTION      SOCIAL HISTORY: Social History   Socioeconomic History  . Marital status: Single    Spouse name: Not on file  . Number of children: Not on file  . Years of education: Not on file  . Highest  education level: Not on file  Occupational History  . Not on file  Tobacco Use  . Smoking status: Former Smoker    Quit date: 08/22/2019    Years since quitting: 0.7  . Smokeless tobacco: Never Used  Substance and Sexual Activity  . Alcohol use: No  . Drug use: No  . Sexual activity: Yes    Birth control/protection: Surgical  Other Topics Concern  . Not on file  Social History Narrative  . Not on file   Social Determinants of Health   Financial Resource Strain:   . Difficulty of Paying Living Expenses:   Food Insecurity:   . Worried About Programme researcher, broadcasting/film/video in the Last Year:   . Barista in the Last Year:   Transportation Needs:   . Freight forwarder (Medical):   Marland Kitchen Lack of Transportation (Non-Medical):   Physical Activity:   . Days of Exercise per Week:   . Minutes of Exercise per Session:   Stress:   . Feeling of Stress :   Social Connections:   . Frequency of Communication with Friends and Family:   . Frequency of Social Gatherings with Friends and Family:   . Attends Religious Services:   . Active Member of Clubs or Organizations:   . Attends Banker Meetings:   Marland Kitchen Marital Status:   Intimate Partner Violence:   . Fear of Current or Ex-Partner:   . Emotionally Abused:   Marland Kitchen Physically Abused:   . Sexually Abused:     FAMILY  HISTORY: Family History  Problem Relation Age of Onset  . Diabetes Mother   . Hypertension Mother     Review of Systems  Constitutional: Positive for fatigue. Negative for appetite change, chills, fever and unexpected weight change.  HENT:   Negative for hearing loss, lump/mass and trouble swallowing.   Eyes: Negative for eye problems and icterus.  Respiratory: Negative for chest tightness, cough and shortness of breath.   Cardiovascular: Negative for chest pain, leg swelling and palpitations.  Gastrointestinal: Negative for abdominal distention, abdominal pain, constipation, diarrhea, nausea and vomiting.   Endocrine: Negative for hot flashes.  Genitourinary: Negative for difficulty urinating.   Musculoskeletal: Negative for arthralgias.  Skin: Negative for itching and rash.  Neurological: Negative for dizziness, extremity weakness, headaches and numbness.  Hematological: Negative for adenopathy. Does not bruise/bleed easily.  Psychiatric/Behavioral: Negative for depression. The patient is not nervous/anxious.       PHYSICAL EXAMINATION  ECOG PERFORMANCE STATUS: 1 - Symptomatic but completely ambulatory  There were no vitals filed for this visit.  Seen in infusion  Physical Exam Constitutional:      General: She is not in acute distress.    Appearance: Normal appearance. She is not toxic-appearing.  HENT:     Head: Normocephalic and atraumatic.  Eyes:     General: No scleral icterus.    Pupils: Pupils are equal, round, and reactive to light.  Cardiovascular:     Rate and Rhythm: Normal rate and regular rhythm.     Pulses: Normal pulses.     Heart sounds: Normal heart sounds.  Pulmonary:     Effort: Pulmonary effort is normal.  Abdominal:     General: Abdomen is flat. Bowel sounds are normal. There is no distension.     Palpations: Abdomen is soft.     Tenderness: There is no abdominal tenderness.  Musculoskeletal:        General: No swelling.     Cervical back: Neck supple.  Skin:    General: Skin is warm and dry.     Findings: No rash.  Neurological:     General: No focal deficit present.     Mental Status: She is alert and oriented to person, place, and time.  Psychiatric:        Mood and Affect: Mood normal.        Behavior: Behavior normal.     LABORATORY DATA:  CBC    Component Value Date/Time   WBC 4.9 05/01/2020 0911   WBC 4.7 11/26/2019 0839   RBC 4.36 05/01/2020 0911   HGB 13.5 05/01/2020 0911   HGB 13.0 12/12/2019 1124   HCT 41.6 05/01/2020 0911   HCT 41.5 12/12/2019 1124   PLT 260 05/01/2020 0911   MCV 95.4 05/01/2020 0911   MCV 89 12/12/2019  1124   MCH 31.0 05/01/2020 0911   MCHC 32.5 05/01/2020 0911   RDW 16.5 (H) 05/01/2020 0911   RDW 13.5 12/12/2019 1124   LYMPHSABS 1.4 05/01/2020 0911   LYMPHSABS 1.7 12/12/2019 1124   MONOABS 0.8 05/01/2020 0911   EOSABS 0.4 05/01/2020 0911   EOSABS 0.6 (H) 12/12/2019 1124   BASOSABS 0.0 05/01/2020 0911   BASOSABS 0.1 12/12/2019 1124    CMP     Component Value Date/Time   NA 140 05/01/2020 0911   NA 139 12/12/2019 1124   K 4.0 05/01/2020 0911   CL 109 05/01/2020 0911   CO2 23 05/01/2020 0911   GLUCOSE 79 05/01/2020 0911   BUN 11  05/01/2020 0911   BUN 21 12/12/2019 1124   CREATININE 0.81 05/01/2020 0911   CALCIUM 8.9 05/01/2020 0911   PROT 6.6 05/01/2020 0911   PROT 7.4 12/12/2019 1124   ALBUMIN 3.6 05/01/2020 0911   ALBUMIN 4.3 12/12/2019 1124   AST 17 05/01/2020 0911   ALT 9 05/01/2020 0911   ALKPHOS 70 05/01/2020 0911   BILITOT 0.7 05/01/2020 0911   GFRNONAA >60 05/01/2020 0911   GFRAA >60 05/01/2020 0911        ASSESSMENT and THERAPY PLAN:   No problem-specific Assessment & Plan notes found for this encounter.  All questions were answered. The patient knows to call the clinic with any problems, questions or concerns. We can certainly see the patient much sooner if necessary.  Total time spent: 20 minutes*  Lillard Anes, NP 05/25/20 1:51 PM Medical Oncology and Hematology [facility] [address] [place], [place] [address] Tel. [phone]    Fax. [phone]  *Total Encounter Time as defined by the Centers for Medicare and Medicaid Services includes, in addition to the face-to-face time of a patient visit (documented in the note above) non-face-to-face time: obtaining and reviewing outside history, ordering and reviewing medications, tests or procedures, care coordination (communications with other health care professionals or caregivers) and documentation in the medical record.

## 2020-05-26 ENCOUNTER — Other Ambulatory Visit: Payer: BC Managed Care – PPO

## 2020-05-26 ENCOUNTER — Ambulatory Visit: Payer: Medicaid Other

## 2020-05-26 ENCOUNTER — Ambulatory Visit: Payer: Medicaid Other | Admitting: Oncology

## 2020-05-26 ENCOUNTER — Inpatient Hospital Stay: Payer: BC Managed Care – PPO

## 2020-05-26 ENCOUNTER — Ambulatory Visit: Payer: Self-pay | Admitting: Adult Health

## 2020-05-26 ENCOUNTER — Other Ambulatory Visit: Payer: Medicaid Other

## 2020-05-29 ENCOUNTER — Inpatient Hospital Stay: Payer: BC Managed Care – PPO | Attending: Oncology

## 2020-05-29 ENCOUNTER — Telehealth: Payer: Self-pay | Admitting: Adult Health

## 2020-05-29 ENCOUNTER — Other Ambulatory Visit: Payer: Self-pay

## 2020-05-29 ENCOUNTER — Inpatient Hospital Stay (HOSPITAL_BASED_OUTPATIENT_CLINIC_OR_DEPARTMENT_OTHER): Payer: BC Managed Care – PPO | Admitting: Adult Health

## 2020-05-29 ENCOUNTER — Inpatient Hospital Stay: Payer: BC Managed Care – PPO

## 2020-05-29 VITALS — BP 167/97 | HR 67 | Temp 98.9°F | Resp 20 | Ht 64.0 in | Wt 243.8 lb

## 2020-05-29 DIAGNOSIS — D510 Vitamin B12 deficiency anemia due to intrinsic factor deficiency: Secondary | ICD-10-CM | POA: Insufficient documentation

## 2020-05-29 DIAGNOSIS — Z87891 Personal history of nicotine dependence: Secondary | ICD-10-CM | POA: Insufficient documentation

## 2020-05-29 DIAGNOSIS — D500 Iron deficiency anemia secondary to blood loss (chronic): Secondary | ICD-10-CM

## 2020-05-29 DIAGNOSIS — D513 Other dietary vitamin B12 deficiency anemia: Secondary | ICD-10-CM

## 2020-05-29 DIAGNOSIS — D518 Other vitamin B12 deficiency anemias: Secondary | ICD-10-CM

## 2020-05-29 DIAGNOSIS — Z9884 Bariatric surgery status: Secondary | ICD-10-CM | POA: Diagnosis not present

## 2020-05-29 DIAGNOSIS — I10 Essential (primary) hypertension: Secondary | ICD-10-CM | POA: Insufficient documentation

## 2020-05-29 DIAGNOSIS — N920 Excessive and frequent menstruation with regular cycle: Secondary | ICD-10-CM | POA: Diagnosis not present

## 2020-05-29 LAB — CMP (CANCER CENTER ONLY)
ALT: 11 U/L (ref 0–44)
AST: 26 U/L (ref 15–41)
Albumin: 3.6 g/dL (ref 3.5–5.0)
Alkaline Phosphatase: 76 U/L (ref 38–126)
Anion gap: 6 (ref 5–15)
BUN: 13 mg/dL (ref 6–20)
CO2: 24 mmol/L (ref 22–32)
Calcium: 8.9 mg/dL (ref 8.9–10.3)
Chloride: 110 mmol/L (ref 98–111)
Creatinine: 0.93 mg/dL (ref 0.44–1.00)
GFR, Est AFR Am: >60 mL/min (ref >60)
GFR, Estimated: >60 mL/min (ref >60)
Glucose, Bld: 99 mg/dL (ref 70–99)
Potassium: 4.3 mmol/L (ref 3.5–5.1)
Sodium: 140 mmol/L (ref 135–145)
Total Bilirubin: 0.5 mg/dL (ref 0.3–1.2)
Total Protein: 6.7 g/dL (ref 6.5–8.1)

## 2020-05-29 LAB — CBC WITH DIFFERENTIAL (CANCER CENTER ONLY)
Abs Immature Granulocytes: 0 K/uL (ref 0.00–0.07)
Basophils Absolute: 0 K/uL (ref 0.0–0.1)
Basophils Relative: 1 %
Eosinophils Absolute: 0.4 K/uL (ref 0.0–0.5)
Eosinophils Relative: 10 %
HCT: 41.2 % (ref 36.0–46.0)
Hemoglobin: 13.5 g/dL (ref 12.0–15.0)
Immature Granulocytes: 0 %
Lymphocytes Relative: 38 %
Lymphs Abs: 1.5 K/uL (ref 0.7–4.0)
MCH: 32.1 pg (ref 26.0–34.0)
MCHC: 32.8 g/dL (ref 30.0–36.0)
MCV: 98.1 fL (ref 80.0–100.0)
Monocytes Absolute: 0.5 K/uL (ref 0.1–1.0)
Monocytes Relative: 13 %
Neutro Abs: 1.6 K/uL — ABNORMAL LOW (ref 1.7–7.7)
Neutrophils Relative %: 38 %
Platelet Count: 226 K/uL (ref 150–400)
RBC: 4.2 MIL/uL (ref 3.87–5.11)
RDW: 13.7 % (ref 11.5–15.5)
WBC Count: 4 K/uL (ref 4.0–10.5)
nRBC: 0 % (ref 0.0–0.2)

## 2020-05-29 MED ORDER — CYANOCOBALAMIN 1000 MCG/ML IJ SOLN
1000.0000 ug | Freq: Once | INTRAMUSCULAR | Status: AC
  Administered 2020-05-29: 1000 ug via INTRAMUSCULAR

## 2020-05-29 MED ORDER — CYANOCOBALAMIN 1000 MCG/ML IJ SOLN
INTRAMUSCULAR | Status: AC
  Filled 2020-05-29: qty 1

## 2020-05-29 NOTE — Patient Instructions (Signed)
Cyanocobalamin, Pyridoxine, and Folate What is this medicine? A multivitamin containing folic acid, vitamin B6, and vitamin B12. This medicine may be used for other purposes; ask your health care provider or pharmacist if you have questions. COMMON BRAND NAME(S): AllanFol RX, AllanTex, Av-Vite FB, B Complex with Folic Acid, ComBgen, FaBB, Folamin, Folastin, Folbalin, Folbee, Folbic, Folcaps, Folgard, Folgard RX, Folgard RX 2.2, Folplex, Folplex 2.2, Foltabs 800, Foltx, Homocysteine Formula, Niva-Fol, NuFol, TL Gard RX, Virt-Gard, Virt-Vite, Virt-Vite Forte, Vita-Respa What should I tell my health care provider before I take this medicine? They need to know if you have any of these conditions:  bleeding or clotting disorder  history of anemia of any type  other chronic health condition  an unusual or allergic reaction to vitamins, other medicines, foods, dyes, or preservatives  pregnant or trying to get pregnant  breast-feeding How should I use this medicine? Take by mouth with a glass of water. May take with food. Follow the directions on the prescription label. It is usually given once a day. Do not take your medicine more often than directed. Contact your pediatrician regarding the use of this medicine in children. Special care may be needed. Overdosage: If you think you have taken too much of this medicine contact a poison control center or emergency room at once. NOTE: This medicine is only for you. Do not share this medicine with others. What if I miss a dose? If you miss a dose, take it as soon as you can. If it is almost time for your next dose, take only that dose. Do not take double or extra doses. What may interact with this medicine?  levodopa This list may not describe all possible interactions. Give your health care provider a list of all the medicines, herbs, non-prescription drugs, or dietary supplements you use. Also tell them if you smoke, drink alcohol, or use illegal  drugs. Some items may interact with your medicine. What should I watch for while using this medicine? See your health care professional for regular checks on your progress. Remember that vitamin supplements do not replace the need for good nutrition from a balanced diet. What side effects may I notice from receiving this medicine? Side effects that you should report to your doctor or health care professional as soon as possible:  allergic reaction such as skin rash or difficulty breathing  vomiting Side effects that usually do not require medical attention (report to your doctor or health care professional if they continue or are bothersome):  nausea  stomach upset This list may not describe all possible side effects. Call your doctor for medical advice about side effects. You may report side effects to FDA at 1-800-FDA-1088. Where should I keep my medicine? Keep out of the reach of children. Most vitamins should be stored at controlled room temperature. Check your specific product directions. Protect from heat and moisture. Throw away any unused medicine after the expiration date. NOTE: This sheet is a summary. It may not cover all possible information. If you have questions about this medicine, talk to your doctor, pharmacist, or health care provider.  2020 Elsevier/Gold Standard (2007-11-18 00:59:55)  

## 2020-05-29 NOTE — Progress Notes (Signed)
[facility] Cancer Follow up:    Mary Agee, NP [address] [place] [address]   DIAGNOSIS: B12 DEFICIENCY/ iron deficiency  SUMMARY OF HEMATOLOGIC HISTORY: ASSESSMENT: 44 y.o. 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 female is here for evaluation of her iron deficiency and b12 deficiency.  She had not been receiving her b12 injections regularly and her b12 level declined.  She notes that she had missed a few appointments and is fatigued and planning on committing to coming back and receiving her injections.    She notes she received the IV iron in April and tolerated it well.  She has undergone uterine ablation and is not having any further heavy cycles.   Patient Active Problem List   Diagnosis Date Noted  . Iron deficiency anemia due to chronic blood loss 01/31/2020  . Essential hypertension 12/22/2019  . Pernicious anemia 02/24/2018  . B12 deficiency anemia 02/24/2018  . Vitamin B12 deficiency anemia due to intrinsic factor deficiency 02/24/2018    has No Known Allergies.  MEDICAL HISTORY: Past Medical History:  Diagnosis Date  . Anemia   . Hypertension     SURGICAL HISTORY: Past Surgical History:  Procedure Laterality Date  . DILITATION & CURRETTAGE/HYSTROSCOPY WITH HYDROTHERMAL ABLATION N/A 12/06/2019   Procedure: DILATATION & CURETTAGE/HYSTEROSCOPY WITH HYDROTHERMAL ABLATION;  Surgeon: Olivia Mackie, MD;  Location: [facility];  Service: Gynecology;  Laterality: N/A;  . GASTRIC BYPASS  08/2009  . KNEE ARTHROSCOPY Left   .  SHOULDER ARTHROSCOPY Left   . TUBAL LIGATION    . WISDOM TOOTH EXTRACTION      SOCIAL HISTORY: Social History   Socioeconomic History  . Marital status: Single    Spouse name: Not on file  . Number of children: Not on file  . Years of education: Not on file  . Highest education level: Not on file  Occupational History  . Not on file  Tobacco Use  . Smoking status: Former Smoker    Quit date: 08/22/2019    Years since quitting: 0.7  . Smokeless tobacco: Never Used  Substance and Sexual Activity  . Alcohol use: No  . Drug use: No  . Sexual activity: Yes    Birth control/protection: Surgical  Other Topics Concern  . Not on file  Social History Narrative  . Not on file   Social Determinants of Health   Financial Resource Strain:   . Difficulty of Paying Living Expenses: Not on file  Food Insecurity:   . Worried About Programme researcher, broadcasting/film/video in the Last Year: Not on file  . Ran Out of Food in the Last Year: Not on file  Transportation Needs:   . Lack of Transportation (Medical): Not on file  . Lack of Transportation (Non-Medical): Not on file  Physical Activity:   . Days of Exercise per Week: Not on file  . Minutes of Exercise per Session: Not on file  Stress:   . Feeling of Stress : Not on file  Social Connections:   . Frequency of Communication with Friends and Family: Not on file  . Frequency of  Social Gatherings with Friends and Family: Not on file  . Attends Religious Services: Not on file  . Active Member of Clubs or Organizations: Not on file  . Attends Banker Meetings: Not on file  . Marital Status: Not on file  Intimate Partner Violence:   . Fear of Current or Ex-Partner: Not on file  . Emotionally Abused: Not on file  . Physically Abused: Not on file  . Sexually Abused: Not on file    FAMILY HISTORY: Family History  Problem Relation Age of Onset  . Diabetes Mother   . Hypertension Mother     Review of Systems  Constitutional: Positive  for fatigue. Negative for appetite change, chills, fever and unexpected weight change.  HENT:   Negative for hearing loss, lump/mass and trouble swallowing.   Eyes: Negative for eye problems and icterus.  Respiratory: Negative for chest tightness, cough and shortness of breath.   Cardiovascular: Negative for chest pain, leg swelling and palpitations.  Gastrointestinal: Negative for abdominal distention, abdominal pain, constipation, diarrhea, nausea and vomiting.  Endocrine: Negative for hot flashes.  Genitourinary: Negative for difficulty urinating.   Musculoskeletal: Negative for arthralgias.  Skin: Negative for itching and rash.  Neurological: Negative for dizziness, extremity weakness, headaches and numbness.  Hematological: Negative for adenopathy. Does not bruise/bleed easily.  Psychiatric/Behavioral: Negative for depression. The patient is not nervous/anxious.       PHYSICAL EXAMINATION  ECOG PERFORMANCE STATUS: 1 - Symptomatic but completely ambulatory  Vitals:   05/29/20 1525  BP: (!) 167/97  Pulse: 67  Resp: 20  Temp: 98.9 F (37.2 C)  SpO2: 100%    Seen in infusion  Physical Exam Constitutional:      General: She is not in acute distress.    Appearance: Normal appearance. She is not toxic-appearing.  HENT:     Head: Normocephalic and atraumatic.     Mouth/Throat:     Comments: Mask in place  Eyes:     General: No scleral icterus. Cardiovascular:     Rate and Rhythm: Normal rate and regular rhythm.     Pulses: Normal pulses.     Heart sounds: Normal heart sounds.  Abdominal:     General: Abdomen is flat. Bowel sounds are normal. There is no distension.     Palpations: Abdomen is soft.     Tenderness: There is no abdominal tenderness.  Musculoskeletal:        General: No swelling.     Cervical back: Neck supple.  Skin:    General: Skin is warm and dry.     Findings: No rash.  Neurological:     General: No focal deficit present.     Mental Status: She  is alert.  Psychiatric:        Mood and Affect: Mood normal.        Behavior: Behavior normal.     LABORATORY DATA:  CBC    Component Value Date/Time   WBC 4.0 05/29/2020 1501   WBC 4.7 11/26/2019 0839   RBC 4.20 05/29/2020 1501   HGB 13.5 05/29/2020 1501   HGB 13.0 12/12/2019 1124   HCT 41.2 05/29/2020 1501   HCT 41.5 12/12/2019 1124   PLT 226 05/29/2020 1501   MCV 98.1 05/29/2020 1501   MCV 89 12/12/2019 1124   MCH 32.1 05/29/2020 1501   MCHC 32.8 05/29/2020 1501   RDW 13.7 05/29/2020 1501   RDW 13.5 12/12/2019 1124   LYMPHSABS 1.5 05/29/2020 1501   LYMPHSABS 1.7  12/12/2019 1124   MONOABS 0.5 05/29/2020 1501   EOSABS 0.4 05/29/2020 1501   EOSABS 0.6 (H) 12/12/2019 1124   BASOSABS 0.0 05/29/2020 1501   BASOSABS 0.1 12/12/2019 1124    CMP     Component Value Date/Time   NA 140 05/29/2020 1501   NA 139 12/12/2019 1124   K 4.3 05/29/2020 1501   CL 110 05/29/2020 1501   CO2 24 05/29/2020 1501   GLUCOSE 99 05/29/2020 1501   BUN 13 05/29/2020 1501   BUN 21 12/12/2019 1124   CREATININE 0.93 05/29/2020 1501   CALCIUM 8.9 05/29/2020 1501   PROT 6.7 05/29/2020 1501   PROT 7.4 12/12/2019 1124   ALBUMIN 3.6 05/29/2020 1501   ALBUMIN 4.3 12/12/2019 1124   AST 26 05/29/2020 1501   ALT 11 05/29/2020 1501   ALKPHOS 76 05/29/2020 1501   BILITOT 0.5 05/29/2020 1501   GFRNONAA >60 05/29/2020 1501   GFRAA >60 05/29/2020 1501        ASSESSMENT and THERAPY PLAN:   B12 deficiency anemia b12 deficiency initially noted in 04/2018.  She has missed several injections and we need to give her weekly injections x 4 followed by monthly injections.  She understands and is in agreement with this plan.  We will f/u with her b12 level again in November.    Iron deficiency anemia due to chronic blood loss Namya last received IV iron in May.  She tolerated this well and has undergone uterine ablation, and has had no further heavy menses.  We will continue to monitor her iron  studies periodically.     All questions were answered. The patient knows to call the clinic with any problems, questions or concerns. We can certainly see the patient much sooner if necessary.  Total time spent: 20 minutes*  Lillard Anes, NP 06/07/20 1:37 PM Medical Oncology and Hematology [facility] [address] [place], [place] [address] Tel. [phone]    Fax. [phone]  *Total Encounter Time as defined by the Centers for Medicare and Medicaid Services includes, in addition to the face-to-face time of a patient visit (documented in the note above) non-face-to-face time: obtaining and reviewing outside history, ordering and reviewing medications, tests or procedures, care coordination (communications with other health care professionals or caregivers) and documentation in the medical record.

## 2020-05-29 NOTE — Telephone Encounter (Signed)
Scheduled appts per 8/19 los. Gave pt a print out of AVS.  

## 2020-06-05 ENCOUNTER — Other Ambulatory Visit: Payer: Self-pay

## 2020-06-05 ENCOUNTER — Inpatient Hospital Stay: Payer: BC Managed Care – PPO

## 2020-06-05 VITALS — BP 139/93 | HR 63 | Resp 18

## 2020-06-05 DIAGNOSIS — Z87891 Personal history of nicotine dependence: Secondary | ICD-10-CM | POA: Diagnosis not present

## 2020-06-05 DIAGNOSIS — D513 Other dietary vitamin B12 deficiency anemia: Secondary | ICD-10-CM

## 2020-06-05 DIAGNOSIS — Z9884 Bariatric surgery status: Secondary | ICD-10-CM | POA: Diagnosis not present

## 2020-06-05 DIAGNOSIS — D510 Vitamin B12 deficiency anemia due to intrinsic factor deficiency: Secondary | ICD-10-CM | POA: Diagnosis not present

## 2020-06-05 DIAGNOSIS — N920 Excessive and frequent menstruation with regular cycle: Secondary | ICD-10-CM | POA: Diagnosis not present

## 2020-06-05 DIAGNOSIS — I10 Essential (primary) hypertension: Secondary | ICD-10-CM | POA: Diagnosis not present

## 2020-06-05 MED ORDER — CYANOCOBALAMIN 1000 MCG/ML IJ SOLN
INTRAMUSCULAR | Status: AC
  Filled 2020-06-05: qty 1

## 2020-06-05 MED ORDER — CYANOCOBALAMIN 1000 MCG/ML IJ SOLN
1000.0000 ug | Freq: Once | INTRAMUSCULAR | Status: AC
  Administered 2020-06-05: 1000 ug via INTRAMUSCULAR

## 2020-06-05 NOTE — Patient Instructions (Signed)

## 2020-06-07 NOTE — Assessment & Plan Note (Signed)
b12 deficiency initially noted in 04/2018.  She has missed several injections and we need to give her weekly injections x 4 followed by monthly injections.  She understands and is in agreement with this plan.  We will f/u with her b12 level again in November.

## 2020-06-07 NOTE — Assessment & Plan Note (Signed)
Mary Alvarado last received IV iron in May.  She tolerated this well and has undergone uterine ablation, and has had no further heavy menses.  We will continue to monitor her iron studies periodically.

## 2020-06-12 ENCOUNTER — Other Ambulatory Visit: Payer: Self-pay

## 2020-06-12 ENCOUNTER — Inpatient Hospital Stay: Payer: BC Managed Care – PPO | Attending: Oncology

## 2020-06-12 VITALS — BP 148/70 | HR 65 | Resp 18

## 2020-06-12 DIAGNOSIS — Z23 Encounter for immunization: Secondary | ICD-10-CM | POA: Insufficient documentation

## 2020-06-12 DIAGNOSIS — D513 Other dietary vitamin B12 deficiency anemia: Secondary | ICD-10-CM

## 2020-06-12 DIAGNOSIS — D510 Vitamin B12 deficiency anemia due to intrinsic factor deficiency: Secondary | ICD-10-CM | POA: Diagnosis not present

## 2020-06-12 MED ORDER — CYANOCOBALAMIN 1000 MCG/ML IJ SOLN
1000.0000 ug | Freq: Once | INTRAMUSCULAR | Status: AC
  Administered 2020-06-12: 1000 ug via INTRAMUSCULAR

## 2020-06-12 MED ORDER — CYANOCOBALAMIN 1000 MCG/ML IJ SOLN
INTRAMUSCULAR | Status: AC
  Filled 2020-06-12: qty 1

## 2020-06-12 NOTE — Patient Instructions (Signed)

## 2020-06-19 ENCOUNTER — Inpatient Hospital Stay: Payer: BC Managed Care – PPO

## 2020-06-19 ENCOUNTER — Telehealth: Payer: Self-pay

## 2020-06-19 NOTE — Telephone Encounter (Signed)
Patient did not show up for injection appointment today. Made Val RN, Vernona Rieger LPN and lindsey aware so that they could follow up with pt

## 2020-06-26 ENCOUNTER — Inpatient Hospital Stay: Payer: BC Managed Care – PPO

## 2020-06-26 ENCOUNTER — Other Ambulatory Visit: Payer: Self-pay

## 2020-06-26 ENCOUNTER — Other Ambulatory Visit: Payer: BC Managed Care – PPO

## 2020-06-26 DIAGNOSIS — D510 Vitamin B12 deficiency anemia due to intrinsic factor deficiency: Secondary | ICD-10-CM

## 2020-06-26 DIAGNOSIS — D513 Other dietary vitamin B12 deficiency anemia: Secondary | ICD-10-CM

## 2020-06-26 DIAGNOSIS — Z23 Encounter for immunization: Secondary | ICD-10-CM | POA: Diagnosis not present

## 2020-06-26 MED ORDER — CYANOCOBALAMIN 1000 MCG/ML IJ SOLN
1000.0000 ug | Freq: Once | INTRAMUSCULAR | Status: AC
  Administered 2020-06-26: 1000 ug via INTRAMUSCULAR

## 2020-06-26 MED ORDER — CYANOCOBALAMIN 1000 MCG/ML IJ SOLN
INTRAMUSCULAR | Status: AC
  Filled 2020-06-26: qty 1

## 2020-06-26 NOTE — Patient Instructions (Signed)

## 2020-07-17 ENCOUNTER — Other Ambulatory Visit: Payer: Self-pay

## 2020-07-17 ENCOUNTER — Ambulatory Visit: Payer: BC Managed Care – PPO

## 2020-07-17 ENCOUNTER — Inpatient Hospital Stay: Payer: BC Managed Care – PPO | Attending: Oncology

## 2020-07-17 DIAGNOSIS — D510 Vitamin B12 deficiency anemia due to intrinsic factor deficiency: Secondary | ICD-10-CM | POA: Diagnosis not present

## 2020-07-17 DIAGNOSIS — Z23 Encounter for immunization: Secondary | ICD-10-CM | POA: Insufficient documentation

## 2020-07-17 NOTE — Progress Notes (Signed)
   Covid-19 Vaccination Clinic  Name:  Mary Alvarado    MRN: [id number] DOB: Jan 05, 1976  07/17/2020  [name] was observed post Covid-19 immunization for 15 mins without incident. She was provided with Vaccine Information Sheet and instruction to access the V-Safe system.   [name] was instructed to call 911 with any severe reactions post vaccine: Marland Kitchen Difficulty breathing  . Swelling of face and throat  . A fast heartbeat  . A bad rash all over body  . Dizziness and weakness   Immunizations Administered    Name Date Dose VIS Date Route   Pfizer COVID-19 Vaccine 07/17/2020 10:10 AM 0.3 mL 12/05/2018 Intramuscular   Manufacturer: [employer], [employer]   Lot: [id number]   NDC: [id number]

## 2020-07-21 ENCOUNTER — Ambulatory Visit: Payer: BC Managed Care – PPO | Admitting: Adult Health

## 2020-07-21 ENCOUNTER — Other Ambulatory Visit: Payer: BC Managed Care – PPO

## 2020-07-21 ENCOUNTER — Ambulatory Visit: Payer: BC Managed Care – PPO

## 2020-07-24 ENCOUNTER — Other Ambulatory Visit: Payer: BC Managed Care – PPO

## 2020-07-24 ENCOUNTER — Ambulatory Visit: Payer: BC Managed Care – PPO

## 2020-08-20 ENCOUNTER — Other Ambulatory Visit: Payer: Self-pay | Admitting: *Deleted

## 2020-08-20 DIAGNOSIS — D510 Vitamin B12 deficiency anemia due to intrinsic factor deficiency: Secondary | ICD-10-CM

## 2020-08-20 DIAGNOSIS — D500 Iron deficiency anemia secondary to blood loss (chronic): Secondary | ICD-10-CM

## 2020-08-21 ENCOUNTER — Inpatient Hospital Stay: Payer: BC Managed Care – PPO | Attending: Oncology

## 2020-08-21 ENCOUNTER — Inpatient Hospital Stay: Payer: BC Managed Care – PPO

## 2020-08-21 ENCOUNTER — Telehealth: Payer: Self-pay

## 2020-08-21 ENCOUNTER — Other Ambulatory Visit: Payer: Self-pay

## 2020-08-21 ENCOUNTER — Inpatient Hospital Stay (HOSPITAL_BASED_OUTPATIENT_CLINIC_OR_DEPARTMENT_OTHER): Payer: BC Managed Care – PPO | Admitting: Adult Health

## 2020-08-21 ENCOUNTER — Encounter: Payer: Self-pay | Admitting: Adult Health

## 2020-08-21 VITALS — BP 157/66 | HR 73 | Temp 99.3°F | Resp 18 | Ht 64.0 in | Wt 251.4 lb

## 2020-08-21 DIAGNOSIS — Z87891 Personal history of nicotine dependence: Secondary | ICD-10-CM | POA: Insufficient documentation

## 2020-08-21 DIAGNOSIS — Z8249 Family history of ischemic heart disease and other diseases of the circulatory system: Secondary | ICD-10-CM | POA: Insufficient documentation

## 2020-08-21 DIAGNOSIS — D510 Vitamin B12 deficiency anemia due to intrinsic factor deficiency: Secondary | ICD-10-CM

## 2020-08-21 DIAGNOSIS — N920 Excessive and frequent menstruation with regular cycle: Secondary | ICD-10-CM | POA: Diagnosis not present

## 2020-08-21 DIAGNOSIS — D500 Iron deficiency anemia secondary to blood loss (chronic): Secondary | ICD-10-CM

## 2020-08-21 DIAGNOSIS — Z9884 Bariatric surgery status: Secondary | ICD-10-CM | POA: Diagnosis not present

## 2020-08-21 DIAGNOSIS — Z79899 Other long term (current) drug therapy: Secondary | ICD-10-CM | POA: Insufficient documentation

## 2020-08-21 DIAGNOSIS — Z833 Family history of diabetes mellitus: Secondary | ICD-10-CM | POA: Insufficient documentation

## 2020-08-21 DIAGNOSIS — D513 Other dietary vitamin B12 deficiency anemia: Secondary | ICD-10-CM

## 2020-08-21 DIAGNOSIS — I10 Essential (primary) hypertension: Secondary | ICD-10-CM | POA: Diagnosis not present

## 2020-08-21 LAB — FERRITIN: Ferritin: 54 ng/mL (ref 11–307)

## 2020-08-21 LAB — CMP (CANCER CENTER ONLY)
ALT: 11 U/L (ref 0–44)
AST: 18 U/L (ref 15–41)
Albumin: 3.5 g/dL (ref 3.5–5.0)
Alkaline Phosphatase: 73 U/L (ref 38–126)
Anion gap: 5 (ref 5–15)
BUN: 16 mg/dL (ref 6–20)
CO2: 26 mmol/L (ref 22–32)
Calcium: 8.6 mg/dL — ABNORMAL LOW (ref 8.9–10.3)
Chloride: 107 mmol/L (ref 98–111)
Creatinine: 0.86 mg/dL (ref 0.44–1.00)
GFR, Estimated: >60 mL/min (ref >60)
Glucose, Bld: 89 mg/dL (ref 70–99)
Potassium: 4.5 mmol/L (ref 3.5–5.1)
Sodium: 138 mmol/L (ref 135–145)
Total Bilirubin: 0.8 mg/dL (ref 0.3–1.2)
Total Protein: 6.6 g/dL (ref 6.5–8.1)

## 2020-08-21 LAB — CBC WITH DIFFERENTIAL (CANCER CENTER ONLY)
Abs Immature Granulocytes: 0.01 10*3/uL (ref 0.00–0.07)
Basophils Absolute: 0 10*3/uL (ref 0.0–0.1)
Basophils Relative: 0 %
Eosinophils Absolute: 0.3 10*3/uL (ref 0.0–0.5)
Eosinophils Relative: 6 %
HCT: 43 % (ref 36.0–46.0)
Hemoglobin: 13.8 g/dL (ref 12.0–15.0)
Immature Granulocytes: 0 %
Lymphocytes Relative: 31 %
Lymphs Abs: 1.5 10*3/uL (ref 0.7–4.0)
MCH: 30.7 pg (ref 26.0–34.0)
MCHC: 32.1 g/dL (ref 30.0–36.0)
MCV: 95.6 fL (ref 80.0–100.0)
Monocytes Absolute: 0.7 10*3/uL (ref 0.1–1.0)
Monocytes Relative: 15 %
Neutro Abs: 2.3 10*3/uL (ref 1.7–7.7)
Neutrophils Relative %: 48 %
Platelet Count: 232 10*3/uL (ref 150–400)
RBC: 4.5 MIL/uL (ref 3.87–5.11)
RDW: 12.8 % (ref 11.5–15.5)
WBC Count: 4.8 10*3/uL (ref 4.0–10.5)
nRBC: 0 % (ref 0.0–0.2)

## 2020-08-21 LAB — IRON AND TIBC
Iron: 88 ug/dL (ref 41–142)
Saturation Ratios: 32 % (ref 21–57)
TIBC: 273 ug/dL (ref 236–444)
UIBC: 185 ug/dL (ref 120–384)

## 2020-08-21 LAB — VITAMIN B12: Vitamin B-12: 234 pg/mL (ref 180–914)

## 2020-08-21 MED ORDER — CYANOCOBALAMIN 1000 MCG/ML IJ SOLN
1000.0000 ug | Freq: Once | INTRAMUSCULAR | Status: AC
  Administered 2020-08-21: 1000 ug via INTRAMUSCULAR

## 2020-08-21 MED ORDER — CYANOCOBALAMIN 1000 MCG/ML IJ SOLN
INTRAMUSCULAR | Status: AC
  Filled 2020-08-21: qty 1

## 2020-08-21 NOTE — Telephone Encounter (Signed)
-----   Message from Loa Socks, NP sent at 08/21/2020  1:04 PM EST ----- Please let her know that her labs are improved.  I will send schedule message to update her appointments and they will call her.  ----- Message ----- From: Interface, Lab In [place] Sent: 08/21/2020   9:48 AM EST To: Loa Socks, NP

## 2020-08-21 NOTE — Patient Instructions (Signed)
Cyanocobalamin, Pyridoxine, and Folate What is this medicine? A multivitamin containing folic acid, vitamin B6, and vitamin B12. This medicine may be used for other purposes; ask your health care provider or pharmacist if you have questions. COMMON BRAND NAME(S): AllanFol RX, AllanTex, Av-Vite FB, B Complex with Folic Acid, ComBgen, FaBB, Folamin, Folastin, Folbalin, Folbee, Folbic, Folcaps, Folgard, Folgard RX, Folgard RX 2.2, Folplex, Folplex 2.2, Foltabs 800, Foltx, Homocysteine Formula, Niva-Fol, NuFol, TL Gard RX, Virt-Gard, Virt-Vite, Virt-Vite Forte, Vita-Respa What should I tell my health care provider before I take this medicine? They need to know if you have any of these conditions:  bleeding or clotting disorder  history of anemia of any type  other chronic health condition  an unusual or allergic reaction to vitamins, other medicines, foods, dyes, or preservatives  pregnant or trying to get pregnant  breast-feeding How should I use this medicine? Take by mouth with a glass of water. May take with food. Follow the directions on the prescription label. It is usually given once a day. Do not take your medicine more often than directed. Contact your pediatrician regarding the use of this medicine in children. Special care may be needed. Overdosage: If you think you have taken too much of this medicine contact a poison control center or emergency room at once. NOTE: This medicine is only for you. Do not share this medicine with others. What if I miss a dose? If you miss a dose, take it as soon as you can. If it is almost time for your next dose, take only that dose. Do not take double or extra doses. What may interact with this medicine?  levodopa This list may not describe all possible interactions. Give your health care provider a list of all the medicines, herbs, non-prescription drugs, or dietary supplements you use. Also tell them if you smoke, drink alcohol, or use illegal  drugs. Some items may interact with your medicine. What should I watch for while using this medicine? See your health care professional for regular checks on your progress. Remember that vitamin supplements do not replace the need for good nutrition from a balanced diet. What side effects may I notice from receiving this medicine? Side effects that you should report to your doctor or health care professional as soon as possible:  allergic reaction such as skin rash or difficulty breathing  vomiting Side effects that usually do not require medical attention (report to your doctor or health care professional if they continue or are bothersome):  nausea  stomach upset This list may not describe all possible side effects. Call your doctor for medical advice about side effects. You may report side effects to FDA at 1-800-FDA-1088. Where should I keep my medicine? Keep out of the reach of children. Most vitamins should be stored at controlled room temperature. Check your specific product directions. Protect from heat and moisture. Throw away any unused medicine after the expiration date. NOTE: This sheet is a summary. It may not cover all possible information. If you have questions about this medicine, talk to your doctor, pharmacist, or health care provider.  2020 Elsevier/Gold Standard (2007-11-18 00:59:55)  

## 2020-08-21 NOTE — Telephone Encounter (Signed)
Called and given below message. She verbalized understanding. She ask about fmla paper work. Told her it would be 7-10 buisness days and from 11/9 when she dropped off. She verbalized understanding.

## 2020-08-25 NOTE — Assessment & Plan Note (Signed)
b12 deficiency initially noted in 04/2018.  She will continue on monthly b12 injections and her level is increasing.  We will recheck her labs every 3 months and she will f/u with [place] in one year.

## 2020-08-25 NOTE — Progress Notes (Signed)
[facility] Cancer Follow up:    Mary Agee, NP [address] [place] [address]   DIAGNOSIS: B12 DEFICIENCY/ iron deficiency  SUMMARY OF HEMATOLOGIC HISTORY: ASSESSMENT: 44 y.o. 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 female is here for evaluation of her iron deficiency and b12 deficiency.  She has missed a few b12 injections in the past.  She underwent uterine ablation.  She is still having cycles, however these are much improved and not as heavy as they previously were.  She continues to receive her b12 injections monthly.  She notes she tolerates these well.     Patient Active Problem List   Diagnosis Date Noted  . Iron deficiency anemia due to chronic blood loss 01/31/2020  . Essential hypertension 12/22/2019  . Pernicious anemia 02/24/2018  . B12 deficiency anemia 02/24/2018  . Vitamin B12 deficiency anemia due to intrinsic factor deficiency 02/24/2018    has No Known Allergies.  MEDICAL HISTORY: Past Medical History:  Diagnosis Date  . Anemia   . Hypertension     SURGICAL HISTORY: Past Surgical History:  Procedure Laterality Date  . DILITATION & CURRETTAGE/HYSTROSCOPY WITH HYDROTHERMAL ABLATION N/A 12/06/2019   Procedure: DILATATION & CURETTAGE/HYSTEROSCOPY WITH HYDROTHERMAL ABLATION;  Surgeon: Olivia Mackie, MD;  Location: [facility];  Service: Gynecology;  Laterality: N/A;  . GASTRIC BYPASS  08/2009  . KNEE ARTHROSCOPY Left   . SHOULDER ARTHROSCOPY Left   . TUBAL LIGATION    . WISDOM TOOTH EXTRACTION      SOCIAL  HISTORY: Social History   Socioeconomic History  . Marital status: Single    Spouse name: Not on file  . Number of children: Not on file  . Years of education: Not on file  . Highest education level: Not on file  Occupational History  . Not on file  Tobacco Use  . Smoking status: Former Smoker    Quit date: 08/22/2019    Years since quitting: 1.0  . Smokeless tobacco: Never Used  Substance and Sexual Activity  . Alcohol use: No  . Drug use: No  . Sexual activity: Yes    Birth control/protection: Surgical  Other Topics Concern  . Not on file  Social History Narrative  . Not on file   Social Determinants of Health   Financial Resource Strain:   . Difficulty of Paying Living Expenses: Not on file  Food Insecurity:   . Worried About Programme researcher, broadcasting/film/video in the Last Year: Not on file  . Ran Out of Food in the Last Year: Not on file  Transportation Needs:   . Lack of Transportation (Medical): Not on file  . Lack of Transportation (Non-Medical): Not on file  Physical Activity:   . Days of Exercise per Week: Not on file  . Minutes of Exercise per Session: Not on file  Stress:   . Feeling of Stress : Not on file  Social Connections:   . Frequency of Communication with Friends and Family: Not on file  . Frequency of Social Gatherings with Friends and Family: Not on file  . Attends Religious Services: Not on  file  . Active Member of Clubs or Organizations: Not on file  . Attends Banker Meetings: Not on file  . Marital Status: Not on file  Intimate Partner Violence:   . Fear of Current or Ex-Partner: Not on file  . Emotionally Abused: Not on file  . Physically Abused: Not on file  . Sexually Abused: Not on file    FAMILY HISTORY: Family History  Problem Relation Age of Onset  . Diabetes Mother   . Hypertension Mother     Review of Systems  Constitutional: Negative for appetite change, chills, fatigue, fever and unexpected weight change.  HENT:    Negative for hearing loss, lump/mass and trouble swallowing.   Eyes: Negative for eye problems and icterus.  Respiratory: Negative for chest tightness, cough and shortness of breath.   Cardiovascular: Negative for chest pain, leg swelling and palpitations.  Gastrointestinal: Negative for abdominal distention, abdominal pain, constipation, diarrhea, nausea and vomiting.  Endocrine: Negative for hot flashes.  Genitourinary: Negative for difficulty urinating.   Musculoskeletal: Negative for arthralgias.  Skin: Negative for itching and rash.  Neurological: Negative for dizziness, extremity weakness, headaches and numbness.  Hematological: Negative for adenopathy. Does not bruise/bleed easily.  Psychiatric/Behavioral: Negative for depression. The patient is not nervous/anxious.       PHYSICAL EXAMINATION  ECOG PERFORMANCE STATUS: 1 - Symptomatic but completely ambulatory  Vitals:   08/21/20 0957  BP: (!) 157/66  Pulse: 73  Resp: 18  Temp: 99.3 F (37.4 C)  SpO2: 100%    Seen in infusion  Physical Exam Constitutional:      General: She is not in acute distress.    Appearance: Normal appearance. She is not toxic-appearing.  HENT:     Head: Normocephalic and atraumatic.     Mouth/Throat:     Comments: Mask in place  Eyes:     General: No scleral icterus. Cardiovascular:     Rate and Rhythm: Normal rate and regular rhythm.     Pulses: Normal pulses.     Heart sounds: Normal heart sounds.  Abdominal:     General: Abdomen is flat. Bowel sounds are normal. There is no distension.     Palpations: Abdomen is soft.     Tenderness: There is no abdominal tenderness.  Musculoskeletal:        General: No swelling.     Cervical back: Neck supple.  Skin:    General: Skin is warm and dry.     Findings: No rash.  Neurological:     General: No focal deficit present.     Mental Status: She is alert.  Psychiatric:        Mood and Affect: Mood normal.        Behavior: Behavior  normal.     LABORATORY DATA:  CBC    Component Value Date/Time   WBC 4.8 08/21/2020 0934   WBC 4.7 11/26/2019 0839   RBC 4.50 08/21/2020 0934   HGB 13.8 08/21/2020 0934   HGB 13.0 12/12/2019 1124   HCT 43.0 08/21/2020 0934   HCT 41.5 12/12/2019 1124   PLT 232 08/21/2020 0934   MCV 95.6 08/21/2020 0934   MCV 89 12/12/2019 1124   MCH 30.7 08/21/2020 0934   MCHC 32.1 08/21/2020 0934   RDW 12.8 08/21/2020 0934   RDW 13.5 12/12/2019 1124   LYMPHSABS 1.5 08/21/2020 0934   LYMPHSABS 1.7 12/12/2019 1124   MONOABS 0.7 08/21/2020 0934   EOSABS 0.3 08/21/2020 0934   EOSABS 0.6 (  H) 12/12/2019 1124   BASOSABS 0.0 08/21/2020 0934   BASOSABS 0.1 12/12/2019 1124    CMP     Component Value Date/Time   NA 138 08/21/2020 0934   NA 139 12/12/2019 1124   K 4.5 08/21/2020 0934   CL 107 08/21/2020 0934   CO2 26 08/21/2020 0934   GLUCOSE 89 08/21/2020 0934   BUN 16 08/21/2020 0934   BUN 21 12/12/2019 1124   CREATININE 0.86 08/21/2020 0934   CALCIUM 8.6 (L) 08/21/2020 0934   PROT 6.6 08/21/2020 0934   PROT 7.4 12/12/2019 1124   ALBUMIN 3.5 08/21/2020 0934   ALBUMIN 4.3 12/12/2019 1124   AST 18 08/21/2020 0934   ALT 11 08/21/2020 0934   ALKPHOS 73 08/21/2020 0934   BILITOT 0.8 08/21/2020 0934   GFRNONAA >60 08/21/2020 0934   GFRAA >60 05/29/2020 1501        ASSESSMENT and THERAPY PLAN:   Iron deficiency anemia due to chronic blood loss We are repeating iron studies today and she continues on prenatal vitamin.  She is having cycles.  She will have labs drawn every 3 months for monitoring.     Vitamin B12 deficiency anemia due to intrinsic factor deficiency b12 deficiency initially noted in 04/2018.  She will continue on monthly b12 injections and her level is increasing.  We will recheck her labs every 3 months and she will f/u with [place] in one year.    All questions were answered. The patient knows to call the clinic with any problems, questions or concerns. We can certainly  see the patient much sooner if necessary.  Total time spent: 20 minutes*  Lillard Anes, NP 08/25/20 9:26 AM Medical Oncology and Hematology [facility] [address] [place], [place] [address] Tel. [phone]    Fax. [phone]  *Total Encounter Time as defined by the Centers for Medicare and Medicaid Services includes, in addition to the face-to-face time of a patient visit (documented in the note above) non-face-to-face time: obtaining and reviewing outside history, ordering and reviewing medications, tests or procedures, care coordination (communications with other health care professionals or caregivers) and documentation in the medical record.

## 2020-08-25 NOTE — Assessment & Plan Note (Signed)
We are repeating iron studies today and she continues on prenatal vitamin.  She is having cycles.  She will have labs drawn every 3 months for monitoring.

## 2020-08-28 ENCOUNTER — Ambulatory Visit: Payer: BC Managed Care – PPO

## 2020-08-28 ENCOUNTER — Other Ambulatory Visit: Payer: BC Managed Care – PPO

## 2020-09-24 ENCOUNTER — Other Ambulatory Visit: Payer: Self-pay | Admitting: *Deleted

## 2020-09-25 ENCOUNTER — Ambulatory Visit: Payer: BC Managed Care – PPO

## 2020-09-25 ENCOUNTER — Other Ambulatory Visit: Payer: BC Managed Care – PPO

## 2020-09-26 ENCOUNTER — Other Ambulatory Visit: Payer: Self-pay

## 2020-09-26 DIAGNOSIS — D510 Vitamin B12 deficiency anemia due to intrinsic factor deficiency: Secondary | ICD-10-CM

## 2020-09-26 DIAGNOSIS — D513 Other dietary vitamin B12 deficiency anemia: Secondary | ICD-10-CM

## 2020-10-23 ENCOUNTER — Other Ambulatory Visit: Payer: Self-pay

## 2020-10-23 ENCOUNTER — Inpatient Hospital Stay: Payer: BC Managed Care – PPO

## 2020-10-23 ENCOUNTER — Inpatient Hospital Stay: Payer: BC Managed Care – PPO | Attending: Oncology

## 2020-10-23 VITALS — BP 146/84 | HR 69 | Temp 98.4°F | Resp 16

## 2020-10-23 DIAGNOSIS — D510 Vitamin B12 deficiency anemia due to intrinsic factor deficiency: Secondary | ICD-10-CM | POA: Insufficient documentation

## 2020-10-23 DIAGNOSIS — D513 Other dietary vitamin B12 deficiency anemia: Secondary | ICD-10-CM

## 2020-10-23 LAB — CBC WITH DIFFERENTIAL (CANCER CENTER ONLY)
Abs Immature Granulocytes: 0.01 10*3/uL (ref 0.00–0.07)
Basophils Absolute: 0 10*3/uL (ref 0.0–0.1)
Basophils Relative: 1 %
Eosinophils Absolute: 0.3 10*3/uL (ref 0.0–0.5)
Eosinophils Relative: 6 %
HCT: 42.9 % (ref 36.0–46.0)
Hemoglobin: 14 g/dL (ref 12.0–15.0)
Immature Granulocytes: 0 %
Lymphocytes Relative: 31 %
Lymphs Abs: 1.2 10*3/uL (ref 0.7–4.0)
MCH: 30.6 pg (ref 26.0–34.0)
MCHC: 32.6 g/dL (ref 30.0–36.0)
MCV: 93.7 fL (ref 80.0–100.0)
Monocytes Absolute: 0.5 10*3/uL (ref 0.1–1.0)
Monocytes Relative: 14 %
Neutro Abs: 1.9 10*3/uL (ref 1.7–7.7)
Neutrophils Relative %: 48 %
Platelet Count: 218 10*3/uL (ref 150–400)
RBC: 4.58 MIL/uL (ref 3.87–5.11)
RDW: 13.8 % (ref 11.5–15.5)
WBC Count: 4 10*3/uL (ref 4.0–10.5)
nRBC: 0 % (ref 0.0–0.2)

## 2020-10-23 LAB — CMP (CANCER CENTER ONLY)
ALT: 10 U/L (ref 0–44)
AST: 17 U/L (ref 15–41)
Albumin: 3.6 g/dL (ref 3.5–5.0)
Alkaline Phosphatase: 74 U/L (ref 38–126)
Anion gap: 6 (ref 5–15)
BUN: 13 mg/dL (ref 6–20)
CO2: 25 mmol/L (ref 22–32)
Calcium: 8.6 mg/dL — ABNORMAL LOW (ref 8.9–10.3)
Chloride: 108 mmol/L (ref 98–111)
Creatinine: 0.83 mg/dL (ref 0.44–1.00)
GFR, Estimated: >60 mL/min (ref >60)
Glucose, Bld: 100 mg/dL — ABNORMAL HIGH (ref 70–99)
Potassium: 3.9 mmol/L (ref 3.5–5.1)
Sodium: 139 mmol/L (ref 135–145)
Total Bilirubin: 1 mg/dL (ref 0.3–1.2)
Total Protein: 6.7 g/dL (ref 6.5–8.1)

## 2020-10-23 LAB — IRON AND TIBC
Iron: 129 ug/dL (ref 41–142)
Saturation Ratios: 47 % (ref 21–57)
TIBC: 271 ug/dL (ref 236–444)
UIBC: 143 ug/dL (ref 120–384)

## 2020-10-23 LAB — VITAMIN B12: Vitamin B-12: 222 pg/mL (ref 180–914)

## 2020-10-23 LAB — FERRITIN: Ferritin: 61 ng/mL (ref 11–307)

## 2020-10-23 MED ORDER — CYANOCOBALAMIN 1000 MCG/ML IJ SOLN
1000.0000 ug | Freq: Once | INTRAMUSCULAR | Status: AC
  Administered 2020-10-23: 1000 ug via INTRAMUSCULAR

## 2020-10-23 NOTE — Progress Notes (Signed)
Appointment scheduled for COVID booster; however, it is not due until 01/15/2021. Patient informed and verbalizes understanding.

## 2020-10-23 NOTE — Patient Instructions (Signed)

## 2020-11-10 DIAGNOSIS — F411 Generalized anxiety disorder: Secondary | ICD-10-CM | POA: Diagnosis not present

## 2020-11-10 DIAGNOSIS — F4322 Adjustment disorder with anxiety: Secondary | ICD-10-CM | POA: Diagnosis not present

## 2020-11-27 ENCOUNTER — Ambulatory Visit: Payer: BC Managed Care – PPO

## 2020-11-27 ENCOUNTER — Other Ambulatory Visit: Payer: BC Managed Care – PPO

## 2020-11-27 NOTE — Progress Notes (Unsigned)
Patient did not show for injection appt today. Left message on her voicemail to request she call the office to reschedule.

## 2020-12-01 ENCOUNTER — Telehealth: Payer: Self-pay | Admitting: Adult Health

## 2020-12-01 NOTE — Telephone Encounter (Signed)
Scheduled appt per 2/21 sch msg- pt is aware of appt date and time   

## 2020-12-03 ENCOUNTER — Inpatient Hospital Stay: Payer: BC Managed Care – PPO | Attending: Oncology

## 2020-12-03 ENCOUNTER — Other Ambulatory Visit: Payer: Self-pay

## 2020-12-03 VITALS — BP 153/89 | HR 80 | Resp 18

## 2020-12-03 DIAGNOSIS — D513 Other dietary vitamin B12 deficiency anemia: Secondary | ICD-10-CM

## 2020-12-03 DIAGNOSIS — E538 Deficiency of other specified B group vitamins: Secondary | ICD-10-CM | POA: Insufficient documentation

## 2020-12-03 DIAGNOSIS — D510 Vitamin B12 deficiency anemia due to intrinsic factor deficiency: Secondary | ICD-10-CM

## 2020-12-03 MED ORDER — CYANOCOBALAMIN 1000 MCG/ML IJ SOLN
1000.0000 ug | Freq: Once | INTRAMUSCULAR | Status: AC
  Administered 2020-12-03: 1000 ug via INTRAMUSCULAR

## 2020-12-03 MED ORDER — CYANOCOBALAMIN 1000 MCG/ML IJ SOLN
INTRAMUSCULAR | Status: AC
  Filled 2020-12-03: qty 1

## 2020-12-08 DIAGNOSIS — F4322 Adjustment disorder with anxiety: Secondary | ICD-10-CM | POA: Diagnosis not present

## 2020-12-08 DIAGNOSIS — F4321 Adjustment disorder with depressed mood: Secondary | ICD-10-CM | POA: Diagnosis not present

## 2020-12-31 ENCOUNTER — Inpatient Hospital Stay: Payer: BC Managed Care – PPO | Attending: Oncology

## 2021-01-30 ENCOUNTER — Other Ambulatory Visit: Payer: Self-pay | Admitting: *Deleted

## 2021-01-30 DIAGNOSIS — D500 Iron deficiency anemia secondary to blood loss (chronic): Secondary | ICD-10-CM

## 2021-02-02 ENCOUNTER — Inpatient Hospital Stay: Payer: Medicaid Other | Attending: Oncology

## 2021-02-02 ENCOUNTER — Inpatient Hospital Stay: Payer: Medicaid Other

## 2021-03-04 ENCOUNTER — Inpatient Hospital Stay: Payer: Medicaid Other | Attending: Oncology

## 2021-03-27 ENCOUNTER — Encounter: Payer: Self-pay | Admitting: Oncology

## 2021-04-03 ENCOUNTER — Inpatient Hospital Stay: Payer: Commercial Managed Care - PPO | Attending: Oncology

## 2021-05-04 ENCOUNTER — Inpatient Hospital Stay: Payer: Commercial Managed Care - PPO

## 2021-05-04 ENCOUNTER — Inpatient Hospital Stay: Payer: Commercial Managed Care - PPO | Attending: Oncology

## 2021-06-04 ENCOUNTER — Inpatient Hospital Stay: Payer: Commercial Managed Care - PPO | Attending: Oncology

## 2021-06-05 ENCOUNTER — Telehealth: Payer: Self-pay | Admitting: Oncology

## 2021-06-05 NOTE — Telephone Encounter (Signed)
R/s appt per 8/25 sch msg. Pt aware.  

## 2021-06-12 ENCOUNTER — Inpatient Hospital Stay: Payer: Commercial Managed Care - PPO | Attending: Oncology

## 2021-07-06 ENCOUNTER — Inpatient Hospital Stay: Payer: Commercial Managed Care - PPO

## 2021-08-05 ENCOUNTER — Ambulatory Visit: Payer: BC Managed Care – PPO

## 2021-08-05 ENCOUNTER — Other Ambulatory Visit: Payer: BC Managed Care – PPO

## 2021-09-06 NOTE — Progress Notes (Incomplete)
[facility] Cancer Follow up:    Mary Agee, NP [address] [place] [address] [place] [address]   DIAGNOSIS: B12 DEFICIENCY/ iron deficiency   CURRENT THERAPY: B12 injections monthly   INTERVAL HISTORY: Mary Alvarado 45 y.o. female is here for evaluation of her iron deficiency and b12 deficiency.    She underwent uterine ablation.  She is still having cycles, however these are much improved and not as heavy as they previously were.    She received her last B12 injection here 12/03/2020.    has No Known Allergies.  MEDICAL HISTORY: Past Medical History:  Diagnosis Date   Anemia    Hypertension     SURGICAL HISTORY: Past Surgical History:  Procedure Laterality Date   DILITATION & CURRETTAGE/HYSTROSCOPY WITH HYDROTHERMAL ABLATION N/A 12/06/2019   Procedure: DILATATION & CURETTAGE/HYSTEROSCOPY WITH HYDROTHERMAL ABLATION;  Surgeon: Olivia Mackie, MD;  Location: [facility];  Service: Gynecology;  Laterality: N/A;   GASTRIC BYPASS  08/2009   KNEE ARTHROSCOPY Left    SHOULDER ARTHROSCOPY Left    TUBAL LIGATION     WISDOM TOOTH EXTRACTION      SOCIAL HISTORY: Social History   Socioeconomic History   Marital status: Single    Spouse name: Not on file   Number of children: Not on file   Years of education: Not on file   Highest education level: Not on file  Occupational History   Not on file  Tobacco Use   Smoking status: Former    Types: Cigarettes    Quit date: 08/22/2019    Years since quitting: 2.0   Smokeless tobacco: Never  Substance and Sexual Activity   Alcohol use: No   Drug use: No   Sexual activity: Yes    Birth control/protection: Surgical  Other Topics Concern   Not on file  Social History Narrative   Not on file   Social Determinants of Health   Financial Resource Strain: Not on file  Food Insecurity: Not on file  Transportation Needs: Not on file  Physical Activity: Not on file  Stress: Not on file  Social  Connections: Not on file  Intimate Partner Violence: Not on file    FAMILY HISTORY: Family History  Problem Relation Age of Onset   Diabetes Mother    Hypertension Mother     PHYSICAL EXAMINATION  ECOG PERFORMANCE STATUS: 1 - Symptomatic but completely ambulatory  There were no vitals filed for this visit.   Sclerae unicteric, EOMs intact Wearing a mask No cervical or supraclavicular adenopathy Lungs no rales or rhonchi Heart regular rate and rhythm Abd soft, nontender, positive bowel sounds MSK no focal spinal tenderness, no upper extremity lymphedema Neuro: nonfocal, well oriented, appropriate affect   LABORATORY DATA:  CBC    Component Value Date/Time   WBC 4.0 10/23/2020 0941   WBC 4.7 11/26/2019 0839   RBC 4.58 10/23/2020 0941   HGB 14.0 10/23/2020 0941   HGB 13.0 12/12/2019 1124   HCT 42.9 10/23/2020 0941   HCT 41.5 12/12/2019 1124   PLT 218 10/23/2020 0941   MCV 93.7 10/23/2020 0941   MCV 89 12/12/2019 1124   MCH 30.6 10/23/2020 0941   MCHC 32.6 10/23/2020 0941   RDW 13.8 10/23/2020 0941   RDW 13.5 12/12/2019 1124   LYMPHSABS 1.2 10/23/2020 0941   LYMPHSABS 1.7 12/12/2019 1124   MONOABS 0.5 10/23/2020 0941   EOSABS 0.3 10/23/2020 0941   EOSABS 0.6 (H) 12/12/2019 1124  BASOSABS 0.0 10/23/2020 0941   BASOSABS 0.1 12/12/2019 1124    CMP     Component Value Date/Time   NA 139 10/23/2020 0941   NA 139 12/12/2019 1124   K 3.9 10/23/2020 0941   CL 108 10/23/2020 0941   CO2 25 10/23/2020 0941   GLUCOSE 100 (H) 10/23/2020 0941   BUN 13 10/23/2020 0941   BUN 21 12/12/2019 1124   CREATININE 0.83 10/23/2020 0941   CALCIUM 8.6 (L) 10/23/2020 0941   PROT 6.7 10/23/2020 0941   PROT 7.4 12/12/2019 1124   ALBUMIN 3.6 10/23/2020 0941   ALBUMIN 4.3 12/12/2019 1124   AST 17 10/23/2020 0941   ALT 10 10/23/2020 0941   ALKPHOS 74 10/23/2020 0941   BILITOT 1.0 10/23/2020 0941   GFRNONAA >60 10/23/2020 0941   GFRAA >60 05/29/2020 1501    ASSESSMENT: 45  y.o.  woman with a history of gastric bypass presenting May 2019 with severe B12 deficiency   (1) intrinsic factor normal but antiparietal cell antibodies +04/10/2018   (2) monthly B12 supplementation started 04/10/2018  (3) Iron deficiency noted in 01/2020 secondary to heavy menses  (a) Feraheme given 01/31/2020 and 02/07/2020  (b) s/p uterine ablation    PLAN: No problem-specific Assessment & Plan notes found for this encounter.  All questions were answered. The patient knows to call the clinic with any problems, questions or concerns. We can certainly see the patient much sooner if necessary.  Total time spent: 20 minutes*   Gustav C. Magrinat, MD 09/07/21 12:39 PM Medical Oncology and Hematology [facility] [address] [place], [place] [address] Tel. [phone]    Fax. [phone]   I, Mickie Bail, am acting as scribe for Dr. Valentino Hue. Magrinat.  {Add scribe attestation statement}   *Total Encounter Time as defined by the Centers for Medicare and Medicaid Services includes, in addition to the face-to-face time of a patient visit (documented in the note above) non-face-to-face time: obtaining and reviewing outside history, ordering and reviewing medications, tests or procedures, care coordination (communications with other health care professionals or caregivers) and documentation in the medical record.

## 2021-09-07 ENCOUNTER — Inpatient Hospital Stay: Payer: Commercial Managed Care - PPO | Admitting: Oncology

## 2021-09-07 ENCOUNTER — Inpatient Hospital Stay: Payer: Commercial Managed Care - PPO | Attending: Oncology

## 2022-04-01 ENCOUNTER — Ambulatory Visit: Payer: Commercial Managed Care - PPO | Admitting: Family Medicine

## 2022-06-24 ENCOUNTER — Ambulatory Visit: Payer: Commercial Managed Care - PPO | Admitting: Family Medicine

## 2023-03-30 ENCOUNTER — Encounter: Payer: Self-pay | Admitting: Oncology

## 2023-03-30 ENCOUNTER — Ambulatory Visit (INDEPENDENT_AMBULATORY_CARE_PROVIDER_SITE_OTHER): Payer: BC Managed Care – PPO

## 2023-03-30 ENCOUNTER — Ambulatory Visit
Admission: EM | Admit: 2023-03-30 | Discharge: 2023-03-30 | Disposition: A | Discharge status: Home / Self Care | Payer: BC Managed Care – PPO | Attending: Internal Medicine | Admitting: Internal Medicine

## 2023-03-30 DIAGNOSIS — M25562 Pain in left knee: Secondary | ICD-10-CM

## 2023-03-30 MED ORDER — METHOCARBAMOL 500 MG PO TABS: 500.0000 mg | ORAL_TABLET | Freq: Two times a day (BID) | ORAL | 0 refills | Status: AC | PRN

## 2023-03-30 NOTE — Discharge Instructions (Signed)
Will call with x-ray results.  Ace wrap applied by clinical staff.  Do not sleep in this.  Apply ice.  I have prescribed you a muscle relaxer to see if this will be helpful.  Please be advised that it can make you sleepy so no driving or drinking alcohol with it.  Follow-up with orthopedist at provided contact information for further evaluation and management.
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
## 2023-03-30 NOTE — ED Triage Notes (Signed)
Pt c/o left knee pain front and back, pt states she caught a cramp in the leg 2 weeks ago and since then the leg has been hurting pt states she is unable to put her weight on the leg, there is some swelling to the knee

## 2023-06-13 ENCOUNTER — Ambulatory Visit
Admission: RE | Admit: 2023-06-13 | Discharge: 2023-06-13 | Disposition: A | Discharge status: Home / Self Care | Payer: BC Managed Care – PPO | Source: Ambulatory Visit | Attending: Internal Medicine | Admitting: Internal Medicine

## 2023-06-13 VITALS — BP 137/83 | HR 65 | Temp 98.2°F | Resp 16

## 2023-06-13 DIAGNOSIS — R21 Rash and other nonspecific skin eruption: Secondary | ICD-10-CM

## 2023-06-13 MED ORDER — PREDNISONE 20 MG PO TABS: 40.0000 mg | ORAL_TABLET | Freq: Every day | ORAL | 0 refills | Status: AC

## 2023-06-13 NOTE — Discharge Instructions (Signed)
I have prescribed you prednisone steroid to help alleviate rash.  Please follow-up if rash persists or worsens.

## 2023-06-13 NOTE — ED Provider Notes (Signed)
[facility]    CSN: [id number] Arrival date & time: 06/13/23  1015      History   Chief Complaint Chief Complaint  Patient presents with   Rash    HPI Mary Alvarado is a 47 y.o. female.   Patient presents with 3-week history of itchy rash to posterior neck, abdomen, right upper extremity, bilateral lower extremities.  Reports that she has taken Benadryl and used topical creams with no improvement.  Denies any changes in environment or any known sick contacts.  Denies any fever.  Reports that she did get a cortisone knee injection prior to rash starting but she has had several cortisone injections previously and never had a reaction.   Rash   Past Medical History:  Diagnosis Date   Anemia    Hypertension     Patient Active Problem List   Diagnosis Date Noted   Iron deficiency anemia due to chronic blood loss 01/31/2020   Essential hypertension 12/22/2019   Pernicious anemia 02/24/2018   B12 deficiency anemia 02/24/2018   Vitamin B12 deficiency anemia due to intrinsic factor deficiency 02/24/2018    Past Surgical History:  Procedure Laterality Date   DILITATION & CURRETTAGE/HYSTROSCOPY WITH HYDROTHERMAL ABLATION N/A 12/06/2019   Procedure: DILATATION & CURETTAGE/HYSTEROSCOPY WITH HYDROTHERMAL ABLATION;  Surgeon: Olivia Mackie, MD;  Location: [facility] [place];  Service: Gynecology;  Laterality: N/A;   GASTRIC BYPASS  08/2009   KNEE ARTHROSCOPY Left    SHOULDER ARTHROSCOPY Left    TUBAL LIGATION     WISDOM TOOTH EXTRACTION      OB History   No obstetric history on file.      Home Medications    Prior to Admission medications   Medication Sig Start Date End Date Taking? Authorizing Provider  ibuprofen (ADVIL,MOTRIN) 800 MG tablet Take 1 tablet (800 mg total) by mouth every 8 (eight) hours as needed for mild pain or moderate pain. 06/23/14  Yes Tomasita Crumble, MD  methocarbamol (ROBAXIN) 500 MG tablet Take 1 tablet (500 mg total) by mouth  2 (two) times daily as needed for muscle spasms. 03/30/23  Yes Braeden Kennan, Rolly Salter E, FNP  predniSONE (DELTASONE) 20 MG tablet Take 2 tablets (40 mg total) by mouth daily for 5 days. 06/13/23 06/18/23 Yes Jourdin Connors, Acie Fredrickson, FNP  Prenatal Vit-Fe Fumarate-FA (PRENATAL MULTIVITAMIN) TABS tablet Take 1 tablet by mouth daily at 12 noon.   Yes [provider]    Family History Family History  Problem Relation Age of Onset   Diabetes Mother    Hypertension Mother     Social History Social History   Tobacco Use   Smoking status: Former    Current packs/day: 0.00    Types: Cigarettes    Quit date: 08/22/2019    Years since quitting: 3.8   Smokeless tobacco: Never  Substance Use Topics   Alcohol use: No   Drug use: No     Allergies   Patient has no known allergies.   Review of Systems Review of Systems Per HPI  Physical Exam Triage Vital Signs ED Triage Vitals [06/13/23 1032]  Encounter Vitals Group     BP 137/83     Systolic BP Percentile      Diastolic BP Percentile      Pulse Rate 65     Resp 16     Temp 98.2 F (36.8 C)     Temp Source Oral     SpO2 98 %     Weight  Height      Head Circumference      Peak Flow      Pain Score      Pain Loc      Pain Education      Exclude from Growth Chart    No data found.  Updated Vital Signs BP 137/83 (BP Location: Left Arm)   Pulse 65   Temp 98.2 F (36.8 C) (Oral)   Resp 16   SpO2 98%   Visual Acuity Right Eye Distance:   Left Eye Distance:   Bilateral Distance:    Right Eye Near:   Left Eye Near:    Bilateral Near:     Physical Exam Constitutional:      General: She is not in acute distress.    Appearance: Normal appearance. She is not toxic-appearing or diaphoretic.  HENT:     Head: Normocephalic and atraumatic.  Eyes:     Extraocular Movements: Extraocular movements intact.     Conjunctiva/sclera: Conjunctivae normal.  Pulmonary:     Effort: Pulmonary effort is normal.  Skin:    Comments:  Maculopapular rash present to right upper arm, posterior neck, lower abdomen, bilateral thighs. Slightly scaly in some areas.   Neurological:     General: No focal deficit present.     Mental Status: She is alert and oriented to person, place, and time. Mental status is at baseline.  Psychiatric:        Mood and Affect: Mood normal.        Behavior: Behavior normal.        Thought Content: Thought content normal.        Judgment: Judgment normal.      UC Treatments / Results  Labs (all labs ordered are listed, but only abnormal results are displayed) Labs Reviewed - No data to display  EKG   Radiology No results found.  Procedures Procedures (including critical care time)  Medications Ordered in UC Medications - No data to display  Initial Impression / Assessment and Plan / UC Course  I have reviewed the triage vital signs and the nursing notes.  Pertinent labs & imaging results that were available during my care of the patient were reviewed by me and considered in my medical decision making (see chart for details).     Differential diagnoses include allergic contact dermatitis versus atopic dermatitis.  There are no signs of bacterial, fungal infection, viral infection on exam.  No concern for insect or mites.  Patient reports rash started after getting cortisone injection but I have a low suspicion this is related as patient has had previous cortisone injections and has never had a reaction.  Given how diffuse rash is, recommended steroid therapy given antihistamines have not been helpful.  Will prescribe 5-day course of prednisone steroid burst.  This should be safe despite cortisone localized injection 3 weeks ago as benefits outweigh risks.  No obvious contraindication to steroid therapy noted in patient's history.  Advised Pepcid may be beneficial as well that she can get over-the-counter.  Patient verbalized understanding and was agreeable with plan. Final Clinical  Impressions(s) / UC Diagnoses   Final diagnoses:  Rash and nonspecific skin eruption     Discharge Instructions      I have prescribed you prednisone steroid to help alleviate rash.  Please follow-up if rash persists or worsens.    ED Prescriptions     Medication Sig Dispense Auth. Provider   predniSONE (DELTASONE) 20 MG tablet Take 2  tablets (40 mg total) by mouth daily for 5 days. 10 tablet Gustavus Bryant, [place]      PDMP not reviewed this encounter.   Gustavus Bryant, [place] 06/13/23 [phone]

## 2023-06-13 NOTE — ED Triage Notes (Signed)
Pt presents for rash all over her body x 2 weeks. Pt has been using benadryl.

## 2024-06-07 ENCOUNTER — Other Ambulatory Visit: Payer: Self-pay | Admitting: Physician Assistant

## 2024-06-07 DIAGNOSIS — Z1231 Encounter for screening mammogram for malignant neoplasm of breast: Secondary | ICD-10-CM

## 2024-06-15 ENCOUNTER — Ambulatory Visit

## 2024-06-15 ENCOUNTER — Encounter: Payer: Self-pay | Admitting: Oncology

## 2024-06-21 ENCOUNTER — Ambulatory Visit

## 2024-07-11 ENCOUNTER — Inpatient Hospital Stay: Attending: Adult Health | Admitting: Adult Health

## 2024-07-11 ENCOUNTER — Encounter: Payer: Self-pay | Admitting: Adult Health

## 2024-07-11 ENCOUNTER — Inpatient Hospital Stay

## 2024-07-11 VITALS — BP 156/96 | HR 65 | Temp 98.7°F | Resp 17 | Wt 259.2 lb

## 2024-07-11 DIAGNOSIS — Z87891 Personal history of nicotine dependence: Secondary | ICD-10-CM | POA: Diagnosis not present

## 2024-07-11 DIAGNOSIS — E538 Deficiency of other specified B group vitamins: Secondary | ICD-10-CM | POA: Diagnosis present

## 2024-07-11 DIAGNOSIS — D72819 Decreased white blood cell count, unspecified: Secondary | ICD-10-CM | POA: Diagnosis not present

## 2024-07-11 DIAGNOSIS — D708 Other neutropenia: Secondary | ICD-10-CM

## 2024-07-11 DIAGNOSIS — Z9884 Bariatric surgery status: Secondary | ICD-10-CM | POA: Insufficient documentation

## 2024-07-11 DIAGNOSIS — E559 Vitamin D deficiency, unspecified: Secondary | ICD-10-CM | POA: Diagnosis not present

## 2024-07-11 DIAGNOSIS — D500 Iron deficiency anemia secondary to blood loss (chronic): Secondary | ICD-10-CM | POA: Insufficient documentation

## 2024-07-11 DIAGNOSIS — N920 Excessive and frequent menstruation with regular cycle: Secondary | ICD-10-CM | POA: Insufficient documentation

## 2024-07-11 LAB — CMP (CANCER CENTER ONLY)
ALT: 5 U/L (ref 0–44)
AST: 13 U/L — ABNORMAL LOW (ref 15–41)
Albumin: 3.7 g/dL (ref 3.5–5.0)
Alkaline Phosphatase: 76 U/L (ref 38–126)
Anion gap: 3 — ABNORMAL LOW (ref 5–15)
BUN: 13 mg/dL (ref 6–20)
CO2: 28 mmol/L (ref 22–32)
Calcium: 8.2 mg/dL — ABNORMAL LOW (ref 8.9–10.3)
Chloride: 108 mmol/L (ref 98–111)
Creatinine: 0.82 mg/dL (ref 0.44–1.00)
GFR, Estimated: >60 mL/min (ref >60)
Glucose, Bld: 83 mg/dL (ref 70–99)
Potassium: 4.1 mmol/L (ref 3.5–5.1)
Sodium: 139 mmol/L (ref 135–145)
Total Bilirubin: 0.9 mg/dL (ref 0.0–1.2)
Total Protein: 6.4 g/dL — ABNORMAL LOW (ref 6.5–8.1)

## 2024-07-11 LAB — CBC WITH DIFFERENTIAL (CANCER CENTER ONLY)
Abs Immature Granulocytes: 0 K/uL (ref 0.00–0.07)
Basophils Absolute: 0 K/uL (ref 0.0–0.1)
Basophils Relative: 1 %
Eosinophils Absolute: 0.3 K/uL (ref 0.0–0.5)
Eosinophils Relative: 9 %
HCT: 39.8 % (ref 36.0–46.0)
Hemoglobin: 12.9 g/dL (ref 12.0–15.0)
Immature Granulocytes: 0 %
Lymphocytes Relative: 32 %
Lymphs Abs: 1.3 K/uL (ref 0.7–4.0)
MCH: 30.8 pg (ref 26.0–34.0)
MCHC: 32.4 g/dL (ref 30.0–36.0)
MCV: 95 fL (ref 80.0–100.0)
Monocytes Absolute: 0.6 K/uL (ref 0.1–1.0)
Monocytes Relative: 15 %
Neutro Abs: 1.7 K/uL (ref 1.7–7.7)
Neutrophils Relative %: 43 %
Platelet Count: 275 K/uL (ref 150–400)
RBC: 4.19 MIL/uL (ref 3.87–5.11)
RDW: 13.7 % (ref 11.5–15.5)
WBC Count: 3.9 K/uL — ABNORMAL LOW (ref 4.0–10.5)
nRBC: 0 % (ref 0.0–0.2)

## 2024-07-11 LAB — RETIC PANEL
Immature Retic Fract: 13.4 % (ref 2.3–15.9)
RBC.: 4.21 MIL/uL (ref 3.87–5.11)
Retic Count, Absolute: 78.7 K/uL (ref 19.0–186.0)
Retic Ct Pct: 1.9 % (ref 0.4–3.1)
Reticulocyte Hemoglobin: 36.4 pg (ref >27.9)

## 2024-07-11 LAB — FERRITIN: Ferritin: 56 ng/mL (ref 11–307)

## 2024-07-11 LAB — HIV ANTIBODY (ROUTINE TESTING W REFLEX): HIV Screen 4th Generation wRfx: NONREACTIVE

## 2024-07-11 LAB — IRON AND IRON BINDING CAPACITY (CC-WL,HP ONLY)
Iron: 108 ug/dL (ref 28–170)
Saturation Ratios: 40 % — ABNORMAL HIGH (ref 10.4–31.8)
TIBC: 270 ug/dL (ref 250–450)
UIBC: 162 ug/dL (ref 148–442)

## 2024-07-11 LAB — HEPATITIS B SURFACE ANTIGEN: Hepatitis B Surface Ag: NONREACTIVE

## 2024-07-11 LAB — LACTATE DEHYDROGENASE: LDH: 156 U/L (ref 98–192)

## 2024-07-11 LAB — FOLATE: Folate: 6 ng/mL (ref >5.9)

## 2024-07-11 LAB — VITAMIN B12: Vitamin B-12: 246 pg/mL (ref 180–914)

## 2024-07-11 LAB — HEPATITIS C ANTIBODY: HCV Ab: NONREACTIVE

## 2024-07-11 LAB — HEPATITIS B CORE ANTIBODY, IGM: Hep B C IgM: NONREACTIVE

## 2024-07-11 NOTE — Progress Notes (Unsigned)
 [facility] Cancer Follow up:    Mary Ade, NP [address] [place] [address]   DIAGNOSIS: Cancer Staging  No matching staging information was found for the patient.    SUMMARY OF HEMATOLOGIC HISTORY:  48 y.o. 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  Mary Alvarado Ned 48 y.o. female returns for    Patient Active Problem List   Diagnosis Date Noted  . Iron deficiency anemia due to chronic blood loss 01/31/2020  . Essential hypertension 12/22/2019  . Pernicious anemia 02/24/2018  . B12 deficiency anemia 02/24/2018  . Vitamin B12 deficiency anemia due to intrinsic factor deficiency 02/24/2018    has no known allergies.  MEDICAL HISTORY: Past Medical History:  Diagnosis Date  . Anemia   . Hypertension     SURGICAL HISTORY: Past Surgical History:  Procedure Laterality Date  . DILITATION & CURRETTAGE/HYSTROSCOPY WITH HYDROTHERMAL ABLATION N/A 12/06/2019   Procedure: DILATATION & CURETTAGE/HYSTEROSCOPY WITH HYDROTHERMAL ABLATION;  Surgeon: Mary Ade, MD;  Location: [facility] [place];  Service: Gynecology;  Laterality: N/A;  . GASTRIC BYPASS  08/2009  . KNEE ARTHROSCOPY Left   . SHOULDER ARTHROSCOPY Left   . TUBAL LIGATION    . WISDOM TOOTH EXTRACTION      SOCIAL HISTORY: Social History   Socioeconomic History  . Marital status: Single    Spouse name: Not on file  . Number of children: Not on file  . Years of education: Not  on file  . Highest education level: Not on file  Occupational History  . Not on file  Tobacco Use  . Smoking status: Former    Current packs/day: 0.00    Types: Cigarettes    Quit date: 08/22/2019    Years since quitting: 4.8  . Smokeless tobacco: Never  Substance and Sexual Activity  . Alcohol use: No  . Drug use: No  . Sexual activity: Yes    Birth control/protection: Surgical  Other Topics Concern  . Not on file  Social History Narrative  . Not on file   Social Drivers of Health   Financial Resource Strain: Not on file  Food Insecurity: Not on file  Transportation Needs: Not on file  Physical Activity: Not on file  Stress: Not on file  Social Connections: Unknown (02/21/2022)   Received from [facility]   Social Network   . Social Network: Not on file  Intimate Partner Violence: Unknown (01/13/2022)   Received from [facility]   HITS   . Physically Hurt: Not on file   . Insult or Talk Down To: Not on file   . Threaten Physical Harm: Not on file   . Scream or Curse: Not on file    FAMILY HISTORY: Family History  Problem Relation Age of Onset  . Diabetes Mother   . Hypertension Mother     Review of Systems - Oncology    PHYSICAL EXAMINATION  Onc Performance Status - 07/11/24 1024       ECOG Perf Status   ECOG Perf Status Fully active, able to carry on all pre-disease performance without restriction      KPS SCALE   KPS % SCORE Normal, no compliants, no evidence of disease          Vitals:   07/11/24 1015  BP: (!) 156/96  Pulse: 65  Resp: 17  Temp: 98.7 F (37.1 C)  SpO2: 100%    Physical Exam  LABORATORY DATA:  CBC    Component Value Date/Time   WBC 4.0 10/23/2020 0941   WBC 4.7 11/26/2019 0839   RBC 4.58 10/23/2020 0941   HGB 14.0 10/23/2020 0941   HGB 13.0 12/12/2019 1124   HCT 42.9 10/23/2020 0941   HCT 41.5 12/12/2019 1124   PLT 218 10/23/2020 0941   MCV 93.7 10/23/2020 0941   MCV 89 12/12/2019 1124   MCH 30.6  10/23/2020 0941   MCHC 32.6 10/23/2020 0941   RDW 13.8 10/23/2020 0941   RDW 13.5 12/12/2019 1124   LYMPHSABS 1.2 10/23/2020 0941   LYMPHSABS 1.7 12/12/2019 1124   MONOABS 0.5 10/23/2020 0941   EOSABS 0.3 10/23/2020 0941   EOSABS 0.6 (H) 12/12/2019 1124   BASOSABS 0.0 10/23/2020 0941   BASOSABS 0.1 12/12/2019 1124    CMP     Component Value Date/Time   NA 139 10/23/2020 0941   NA 139 12/12/2019 1124   K 3.9 10/23/2020 0941   CL 108 10/23/2020 0941   CO2 25 10/23/2020 0941   GLUCOSE 100 (H) 10/23/2020 0941   BUN 13 10/23/2020 0941   BUN 21 12/12/2019 1124   CREATININE 0.83 10/23/2020 0941   CALCIUM 8.6 (L) 10/23/2020 0941   PROT 6.7 10/23/2020 0941   PROT 7.4 12/12/2019 1124   ALBUMIN 3.6 10/23/2020 0941   ALBUMIN 4.3 12/12/2019 1124   AST 17 10/23/2020 0941   ALT 10 10/23/2020 0941   ALKPHOS 74 10/23/2020 0941   BILITOT 1.0 10/23/2020 0941   GFRNONAA >60 10/23/2020 0941   GFRAA >60 05/29/2020 1501     ASSESSMENT and THERAPY PLAN:   No problem-specific Assessment & Plan notes found for this encounter.     All questions were answered. The patient knows to call the clinic with any problems, questions or concerns. We can certainly see the patient much sooner if necessary.  Total encounter time:*** minutes*in face-to-face visit time, chart review, lab review, care coordination, order entry, and documentation of the encounter time.    Mary Kendall, NP 07/11/24 10:42 AM Medical Oncology and Hematology [facility] [address] [place], [place] [address] Tel. [phone]    Fax. [phone]  *Total Encounter Time as defined by the Centers for Medicare and Medicaid Services includes, in addition to the face-to-face time of a patient visit (documented in the note above) non-face-to-face time: obtaining and reviewing outside history, ordering and reviewing medications, tests or procedures, care coordination (communications with other health care  professionals or caregivers) and documentation in the medical record.

## 2024-07-12 LAB — HEPATITIS B CORE ANTIBODY, TOTAL: HEP B CORE AB: NEGATIVE

## 2024-07-12 LAB — ANTINUCLEAR ANTIBODIES, IFA: ANA Ab, IFA: NEGATIVE

## 2024-07-13 ENCOUNTER — Encounter: Payer: Self-pay | Admitting: Oncology

## 2024-07-13 LAB — COPPER, SERUM: Copper: 103 ug/dL (ref 80–158)

## 2024-07-16 ENCOUNTER — Ambulatory Visit: Payer: Self-pay

## 2024-07-16 NOTE — Telephone Encounter (Signed)
-----   Message from Morna JAYSON Kendall sent at 07/16/2024  8:40 AM EDT ----- Patient results are good, slightly low b12 and folate.  Can you let her know nothing to worry about, but we need a telephone visit to discuss next steps.  Thanks, LC ----- Message ----- From: Interface, Lab In [place] Sent: 07/11/2024  11:42 AM EDT To: Morna Dalton Kendall, NP
